# Patient Record
Sex: Female | Born: 1975 | Hispanic: Yes | Marital: Single | Smoking: Never smoker
Health system: Southern US, Community
[De-identification: ages and names within clinical notes are randomized; demographics above are authoritative.]

## PROBLEM LIST (undated history)

## (undated) DIAGNOSIS — G8929 Other chronic pain: Secondary | ICD-10-CM

## (undated) DIAGNOSIS — N946 Dysmenorrhea, unspecified: Secondary | ICD-10-CM

## (undated) DIAGNOSIS — R519 Headache, unspecified: Secondary | ICD-10-CM

## (undated) DIAGNOSIS — R631 Polydipsia: Secondary | ICD-10-CM

## (undated) DIAGNOSIS — R059 Cough, unspecified: Secondary | ICD-10-CM

## (undated) DIAGNOSIS — R7303 Prediabetes: Secondary | ICD-10-CM

## (undated) DIAGNOSIS — R0602 Shortness of breath: Secondary | ICD-10-CM

## (undated) DIAGNOSIS — N3945 Continuous leakage: Secondary | ICD-10-CM

## (undated) DIAGNOSIS — R61 Generalized hyperhidrosis: Secondary | ICD-10-CM

## (undated) DIAGNOSIS — D649 Anemia, unspecified: Secondary | ICD-10-CM

## (undated) DIAGNOSIS — K760 Fatty (change of) liver, not elsewhere classified: Secondary | ICD-10-CM

## (undated) DIAGNOSIS — K219 Gastro-esophageal reflux disease without esophagitis: Secondary | ICD-10-CM

## (undated) DIAGNOSIS — F419 Anxiety disorder, unspecified: Secondary | ICD-10-CM

## (undated) DIAGNOSIS — J029 Acute pharyngitis, unspecified: Secondary | ICD-10-CM

## (undated) DIAGNOSIS — H539 Unspecified visual disturbance: Secondary | ICD-10-CM

## (undated) DIAGNOSIS — K429 Umbilical hernia without obstruction or gangrene: Secondary | ICD-10-CM

## (undated) DIAGNOSIS — M549 Dorsalgia, unspecified: Secondary | ICD-10-CM

## (undated) HISTORY — DX: Continuous leakage: N39.45

## (undated) HISTORY — DX: Umbilical hernia without obstruction or gangrene: K42.9

## (undated) HISTORY — DX: Headache, unspecified: R51.9

## (undated) HISTORY — DX: Dysmenorrhea, unspecified: N94.6

## (undated) HISTORY — PX: ANKLE SURGERY: SHX546

## (undated) HISTORY — DX: Unspecified visual disturbance: H53.9

## (undated) HISTORY — DX: Anemia, unspecified: D64.9

## (undated) HISTORY — DX: Cough, unspecified: R05.9

## (undated) HISTORY — DX: Polydipsia: R63.1

## (undated) HISTORY — DX: Dorsalgia, unspecified: M54.9

## (undated) HISTORY — DX: Anxiety disorder, unspecified: F41.9

## (undated) HISTORY — DX: Shortness of breath: R06.02

## (undated) HISTORY — DX: Generalized hyperhidrosis: R61

## (undated) HISTORY — DX: Prediabetes: R73.03

## (undated) HISTORY — DX: Acute pharyngitis, unspecified: J02.9

## (undated) HISTORY — DX: Fatty (change of) liver, not elsewhere classified: K76.0

## (undated) HISTORY — DX: Other chronic pain: G89.29

## (undated) HISTORY — DX: Gastro-esophageal reflux disease without esophagitis: K21.9

---

## 2019-10-09 ENCOUNTER — Other Ambulatory Visit: Payer: Self-pay

## 2019-10-09 ENCOUNTER — Ambulatory Visit: Payer: Self-pay | Admitting: Physician Assistant

## 2019-10-09 VITALS — BP 130/84 | HR 72 | Temp 98.2°F | Resp 18 | Ht 62.0 in

## 2019-10-09 DIAGNOSIS — R519 Headache, unspecified: Secondary | ICD-10-CM

## 2019-10-09 DIAGNOSIS — J302 Other seasonal allergic rhinitis: Secondary | ICD-10-CM

## 2019-10-09 MED ORDER — FLUTICASONE PROPIONATE 50 MCG/ACT NA SUSP: 2.0000 | Freq: Every day | NASAL | 6 refills | Status: DC

## 2019-10-09 MED ORDER — CETIRIZINE HCL 10 MG PO TABS: 10.0000 mg | ORAL_TABLET | Freq: Every day | ORAL | 11 refills | Status: DC

## 2019-10-09 MED ORDER — IBUPROFEN 600 MG PO TABS: 600.0000 mg | ORAL_TABLET | Freq: Three times a day (TID) | ORAL | 0 refills | Status: DC | PRN

## 2019-10-09 NOTE — Patient Instructions (Signed)
Dolor de cabeza sinusal Sinus Headache  El dolor de cabeza sinusal aparece cuando los senos paranasales se taponan o se inflaman. Los senos paranasales son espacios llenos de aire que se encuentran en el crneo detrs de los huesos de la cara y la frente. Los dolores de cabeza sinusales pueden ser leves o intensos. Cules son las causas? El dolor de cabeza sinusal puede deberse a diferentes trastornos que [employer] senos paranasales. Las causas ms frecuentes son las siguientes:  Resfros.  Infecciones de los senos paranasales.  Alergias. Muchas personas confunden los dolores de cabeza sinusales con migraas o dolores de cabeza tensionales ya que esos dolores de cabeza tambin pueden causar dolor facial y sntomas nasales. Cules son los signos o los sntomas? El principal sntoma de esta afeccin es un dolor de cabeza que se puede percibir como dolor u presin en la cara, la frente, los odos o las piezas dentales superiores. Las personas que tienen dolor de cabeza sinusal suelen tener otros sntomas, por ejemplo:  Secrecin o congestin nasal.  Cristy Hilts.  Imposibilidad para Cytogeneticist. Los cambios climticos pueden intensificar los sntomas. Cmo se diagnostica? Esta afeccin se puede diagnosticar en funcin de lo siguiente:  Un examen fsico y antecedentes mdicos.  Estudios de diagnstico por imgenes, como una tomografa computarizada (TC) o una resonancia magntica (RM), para Product manager presencia de problemas en los senos paranasales.  El examen de los senos paranasales con un instrumento delgado con una cmara que se inserta a travs de la nariz (endoscopa). Cmo se trata? El tratamiento de esta afeccin depende de la causa.  El dolor sinusal causado por una infeccin de los senos paranasales se puede tratar con antibiticos.  El dolor sinusal causado por alergias se puede aliviar con medicamentos antialrgicos (antihistamnicos) y Therapist, nutritional.  El dolor sinusal causado por congestin se puede Public house manager al Child psychotherapist (lavar) la Lawyer y los senos paranasales con una solucin salina.  En algunos casos, puede ser necesaria la ciruga sinusal si otros tratamientos no son eficaces. Siga estas indicaciones en su casa: Instrucciones generales  Si se lo indican: ? Aplique un pao tibio y [employer] cara para ayudar a Best boy. ? Use solucin salina nasal. Medicamentos   Tome los medicamentos de venta libre y los recetados solamente como se lo haya indicado el mdico.  Si le recetaron un antibitico, tmelo como se lo haya indicado el mdico. No deje de tomar el antibitico aunque comience a sentirse mejor.  Si est congestionado, utilice un aerosol nasal para ayudar a Clinical cytogeneticist presin. Hidrtese y humidifique los ambientes  Beba suficiente agua para Theatre manager la orina clara o de color amarillo plido. Mantenerse hidratado lo ayudar a [employer].  Use un humidificador de vapor fro para mantener la humedad de su hogar por encima del 50%.  Realice inhalaciones de vapor por 10 a 85minutos, de 3 a 4veces al da o tal como se lo haya indicado el mdico. Puede hacer esto en el bao con el vapor del agua caliente de la ducha.  Limite la exposicin al aire fro o seco. Comunquese con un mdico si:  Tiene dolores de cabeza ms de una vez por semana.  Tiene sensibilidad a la luz o al ruido.  Tiene fiebre.  Siente nuseas o vomita.  Los dolores de cabeza no mejoran con Dispensing optician. Muchas personas creen que tienen dolor de cabeza sinusal cuando en realidad tiene una migraa o un dolor de cabeza tensional. Solicite ayuda  de inmediato si:  Tiene problemas de visin.  Siente un dolor intenso repentino en el rostro o la cabeza.  Tiene una convulsin.  Se siente confundido.  Presenta rigidez en el cuello. Resumen  El dolor de cabeza sinusal aparece cuando los senos paranasales se taponan o  se inflaman.  El dolor de cabeza sinusal puede deberse a diferentes trastornos que [employer] senos paranasales, como un resfro, una infeccin sinusal o Vella Raring.  El tratamiento de esta afeccin depende de la causa. Esto puede incluir medicamentos, como antibiticos o antihistamnicos. Esta informacin no tiene Theme park manager el consejo del mdico. Asegrese de hacerle al mdico cualquier pregunta que tenga. Document Revised: 08/26/2017 Document Reviewed: 05/29/2017 Elsevier Patient Education  2020 [employer].

## 2019-10-09 NOTE — Progress Notes (Signed)
New Patient Office Visit  Subjective:  Patient ID: Carolee Rota, female    DOB: November 22, 1975  Age: 44 y.o. MRN: [id number]  CC:  Chief Complaint  Patient presents with  . Facial Pain    HPI Francina Ames Bentancourth reports that she has been having pain bilaterally on both cheeks for the past few days.  Reports that she has taken amoxicillin x1 dose and Tylenol without much relief.  Reports that she was given amoxicillin from a friend.  Denies any other URI symptoms.   Due to language barrier, an interpreter was present during the history-taking and subsequent discussion (and for part of the physical exam) with this patient.   History reviewed. No pertinent past medical history.  History reviewed. No pertinent surgical history.  History reviewed. No pertinent family history.  Social History   Socioeconomic History  . Marital status: Unknown    Spouse name: Not on file  . Number of children: Not on file  . Years of education: Not on file  . Highest education level: Not on file  Occupational History  . Not on file  Tobacco Use  . Smoking status: Never Smoker  . Smokeless tobacco: Never Used  Substance and Sexual Activity  . Alcohol use: Not Currently  . Drug use: Never  . Sexual activity: Not Currently  Other Topics Concern  . Not on file  Social History Narrative  . Not on file   Social Determinants of Health   Financial Resource Strain:   . Difficulty of Paying Living Expenses:   Food Insecurity:   . Worried About Programme researcher, broadcasting/film/video in the Last Year:   . Barista in the Last Year:   Transportation Needs:   . Freight forwarder (Medical):   Marland Kitchen Lack of Transportation (Non-Medical):   Physical Activity:   . Days of Exercise per Week:   . Minutes of Exercise per Session:   Stress:   . Feeling of Stress :   Social Connections:   . Frequency of Communication with Friends and Family:   . Frequency of Social Gatherings with Friends and  Family:   . Attends Religious Services:   . Active Member of Clubs or Organizations:   . Attends Banker Meetings:   Marland Kitchen Marital Status:   Intimate Partner Violence:   . Fear of Current or Ex-Partner:   . Emotionally Abused:   Marland Kitchen Physically Abused:   . Sexually Abused:     ROS Review of Systems  Constitutional: Negative for chills, fatigue and fever.  HENT: Positive for sinus pressure and sinus pain. Negative for congestion, ear pain, postnasal drip, sneezing and sore throat.   Eyes: Negative for itching.  Respiratory: Negative for cough.   Cardiovascular: Negative.   Gastrointestinal: Negative.   Endocrine: Negative.   Genitourinary: Negative.   Musculoskeletal: Negative.   Skin: Negative.   Allergic/Immunologic: Negative.   Neurological: Positive for headaches. Negative for dizziness, syncope, facial asymmetry and light-headedness.  Hematological: Negative.   Psychiatric/Behavioral: Negative.     Objective:   Today's Vitals: BP 130/84 (BP Location: Right Arm, Patient Position: Sitting, Cuff Size: Normal)   Pulse 72   Temp 98.2 F (36.8 C) (Oral)   Resp 18   Ht 5\' 2"  (1.575 m)   SpO2 100%   Physical Exam Vitals and nursing note reviewed.  Constitutional:      General: She is not in acute distress.    Appearance: Normal appearance. She is not  ill-appearing.  HENT:     Head: Normocephalic and atraumatic.     Right Ear: Tympanic membrane, ear canal and external ear normal.     Left Ear: Tympanic membrane, ear canal and external ear normal.     Nose: No congestion or rhinorrhea.     Right Turbinates: Enlarged and swollen.     Left Turbinates: Enlarged and swollen.     Right Sinus: Maxillary sinus tenderness and frontal sinus tenderness present.     Left Sinus: Frontal sinus tenderness present.     Mouth/Throat:     Lips: Pink.     Mouth: Mucous membranes are moist.     Dentition: Dental caries present.     Pharynx: Oropharynx is clear. No oropharyngeal  exudate or posterior oropharyngeal erythema.     Tonsils: No tonsillar exudate.  Eyes:     Extraocular Movements: Extraocular movements intact.     Conjunctiva/sclera: Conjunctivae normal.     Pupils: Pupils are equal, round, and reactive to light.  Cardiovascular:     Rate and Rhythm: Normal rate and regular rhythm.     Pulses: Normal pulses.     Heart sounds: Normal heart sounds.  Pulmonary:     Effort: Pulmonary effort is normal.     Breath sounds: Normal breath sounds.  Abdominal:     General: Abdomen is flat. Bowel sounds are normal.     Palpations: Abdomen is soft.  Musculoskeletal:        General: Normal range of motion.     Cervical back: Normal range of motion and neck supple.  Lymphadenopathy:     Cervical: No cervical adenopathy.  Skin:    General: Skin is warm and dry.  Neurological:     General: No focal deficit present.     Mental Status: She is alert and oriented to person, place, and time.  Psychiatric:        Mood and Affect: Mood normal.        Behavior: Behavior normal.        Thought Content: Thought content normal.        Judgment: Judgment normal.     Assessment & Plan:   Problem List Items Addressed This Visit    None    Visit Diagnoses    Sinus headache    -  Primary   Seasonal allergies          No outpatient encounter medications on file as of 10/09/2019.   No facility-administered encounter medications on file as of 10/09/2019.  1. Sinus headache Encouraged trial of Flonase, Zyrtec, ibuprofen.  Gave patient education on proper use of antibiotics.  Patient is uninsured, does not have primary care provider.  Gave patient education on [place] financial assistance, patient given appointment to establish primary care at [facility] on November 06 2019 2. Seasonal allergies   I have reviewed the patient's medical history (PMH, PSH, Social History, Family History, Medications, and allergies) , and have been updated if relevant. I spent  23 minutes reviewing chart and  face to face time with patient.     Follow-up: Return in about 4 weeks (around 11/06/2019) for To establish PCP, with Dr. Juleen China at [place].   Loraine Grip Mayers, PA-C

## 2019-10-10 ENCOUNTER — Telehealth: Payer: Self-pay | Admitting: Pediatric Intensive Care

## 2019-10-10 NOTE — Telephone Encounter (Signed)
Call from client with Allison-case worker at [employer], interpretation. Client describes "toothache" facial swelling/pressure and eye pain. CN advised client of [facility] and that she could arrive any time before 1715 for walk in appointment. CN offered transportation to appointment but client declined and stated she would try to find someone to drive her. CN also advised client that she would not be charged at time of appointment but would receive a bill. CN advised that she or Revonda Standard can assist with CFA application if needed.  Shann Medal RN BSN CNP [phone]

## 2019-11-06 ENCOUNTER — Telehealth: Payer: Self-pay | Admitting: Internal Medicine

## 2020-07-16 ENCOUNTER — Ambulatory Visit: Payer: Self-pay | Admitting: *Deleted

## 2020-07-16 NOTE — Telephone Encounter (Signed)
Please call pt and set up an appointment.

## 2020-07-16 NOTE — Telephone Encounter (Signed)
Attempted to call patient to set up new patient appt. Via interpreter Daphine Deutscher [phone]. No answer, unable to leave voicemail message, mailbox has not been set up.

## 2020-07-16 NOTE — Telephone Encounter (Signed)
Patient requesting to set up new patient appt via interpretor Jose ID# [phone] and reported pain in left side under ribs x 2 months that comes and goes with sour taste in her mouth. Reports pain comes and goes and she has vomited 2 times due to symptoms. Pain can last an entire day and if can affect her appetite and she is unable to eat or drink when pain starts. A warm blanket to her left side helps relieve pain and patient did not reports she has tried any medications to relieve pain. Patient has tried olive oil for symptoms with no relief. Reports she has had episodes of difficulty breathing and sweating during and episode of the left sided pain. Patient denies chest pain, difficulty breathing or sweating or sour taste in her mouth at this time. Patient was a Dr. Visit with her child and unable to complete call and set up appt. Instructed patient via interpreter to call back to set up appt and how to assist with treatment for left side pain and sour taste in mouth. If difficulty breathing and sweating occur seek help. Patient verbalized understanding to call back.   Reason for Disposition . [1] Patient says chest pain feels exactly the same as previously diagnosed "heartburn" AND [2] describes burning in chest AND [3] accompanying sour taste in mouth  Answer Assessment - Initial Assessment Questions 1. LOCATION: "Where does it hurt?"       Left side below ribs 2. RADIATION: "Does the pain go anywhere else?" (e.g., into neck, jaw, arms, back)     Sometimes to her stomach  3. ONSET: "When did the chest pain begin?" (Minutes, hours or days)      Left rib pain started 2 months ago  4. PATTERN "Does the pain come and go, or has it been constant since it started?"  "Does it get worse with exertion?"      Comes and goes  5. DURATION: "How long does it last" (e.g., seconds, minutes, hours)     Varies sometimes a whole day  6. SEVERITY: "How bad is the pain?"  (e.g., Scale 1-10; mild, moderate, or severe)     - MILD (1-3): doesn't interfere with normal activities     - MODERATE (4-7): interferes with normal activities or awakens from sleep    - SEVERE (8-10): excruciating pain, unable to do any normal activities       Moderate to severe.  7. CARDIAC RISK FACTORS: "Do you have any history of heart problems or risk factors for heart disease?" (e.g., angina, prior heart attack; diabetes, high blood pressure, high cholesterol, smoker, or strong family history of heart disease)     Na  8. PULMONARY RISK FACTORS: "Do you have any history of lung disease?"  (e.g., blood clots in lung, asthma, emphysema, birth control pills)     na 9. CAUSE: "What do you think is causing the chest pain?"     Not sure but has a sour taste in her mouth  10. OTHER SYMPTOMS: "Do you have any other symptoms?" (e.g., dizziness, nausea, vomiting, sweating, fever, difficulty breathing, cough)       Difficulty breathing and sweating at times but not now  11. PREGNANCY: "Is there any chance you are pregnant?" "When was your last menstrual period?"       na  Protocols used: CHEST PAIN-A-AH

## 2020-07-17 NOTE — Telephone Encounter (Signed)
Patient called back and was scheduled.

## 2020-08-12 ENCOUNTER — Ambulatory Visit: Payer: Self-pay | Attending: Physician Assistant | Admitting: Physician Assistant

## 2020-08-12 ENCOUNTER — Encounter: Payer: Self-pay | Admitting: Physician Assistant

## 2020-08-12 ENCOUNTER — Other Ambulatory Visit: Payer: Self-pay

## 2020-08-12 DIAGNOSIS — Z131 Encounter for screening for diabetes mellitus: Secondary | ICD-10-CM

## 2020-08-12 DIAGNOSIS — R5383 Other fatigue: Secondary | ICD-10-CM

## 2020-08-12 DIAGNOSIS — R109 Unspecified abdominal pain: Secondary | ICD-10-CM

## 2020-08-12 NOTE — Progress Notes (Signed)
Patient ID: Hayley Garcia, female   DOB: 01-27-1976, 45 y.o.   MRN: [id number] Virtual Visit via Telephone Note  I connected with Hayley Garcia on 08/12/20 at  2:50 PM EDT by telephone and verified that I am speaking with the correct person using two identifiers.  Location: Patient: home Provider: [facility] office [place] with pacific interpreters   I discussed the limitations, risks, security and privacy concerns of performing an evaluation and management service by telephone and the availability of in person appointments. I also discussed with the patient that there may be a patient responsible charge related to this service. The patient expressed understanding and agreed to proceed.  History of Present Illness:  Patient is having fatigue for about 3 months.  She also c/o some burping and midepigastric discomfort for a few months too.  She has no ride and is unable to drive.  She has a 54 year old daughter.  No specific complaints but feels as though she "might die and leave her daughter." no CP/HA/dizziness/SOB/vision change.  Observations/Objective: NAD.  A&Ox3  Assessment and Plan: 1. Screening for diabetes mellitus I have had a lengthy discussion and provided education about insulin resistance and the intake of too much sugar/refined carbohydrates.  I have advised the patient to work at a goal of eliminating sugary drinks, candy, desserts, sweets, refined sugars, processed foods, and white carbohydrates.  The patient expresses understanding.  - Hemoglobin A1c  2. Side pain - Comprehensive metabolic panel - CBC with Differential/Platelet  3. Fatigue, unspecified type - Thyroid Panel With TSH - Vitamin D, 25-hydroxy  Follow Up Instructions: Assign PCP-2-3 months Call 911 with any emergent s/sx.  Patient vertbalizes understanding   Try to call with labs at 1:20 pm  I discussed the assessment and treatment plan with the patient. The patient was provided an  opportunity to ask questions and all were answered. The patient agreed with the plan and demonstrated an understanding of the instructions.   The patient was advised to call back or seek an in-person evaluation if the symptoms worsen or if the condition fails to improve as anticipated.  I provided 19 minutes of non-face-to-face time during this encounter.   Georgian Co, PA-C

## 2020-08-13 LAB — CBC WITH DIFFERENTIAL/PLATELET
Basophils Absolute: 0.1 10*3/uL (ref 0.0–0.2)
Basos: 1 %
EOS (ABSOLUTE): 0.4 10*3/uL (ref 0.0–0.4)
Eos: 4 %
Hematocrit: 46.5 % (ref 34.0–46.6)
Hemoglobin: 15.1 g/dL (ref 11.1–15.9)
Immature Grans (Abs): 0 10*3/uL (ref 0.0–0.1)
Immature Granulocytes: 0 %
Lymphocytes Absolute: 2.1 10*3/uL (ref 0.7–3.1)
Lymphs: 25 %
MCH: 28.3 pg (ref 26.6–33.0)
MCHC: 32.5 g/dL (ref 31.5–35.7)
MCV: 87 fL (ref 79–97)
Monocytes Absolute: 0.7 10*3/uL (ref 0.1–0.9)
Monocytes: 8 %
Neutrophils Absolute: 5.5 10*3/uL (ref 1.4–7.0)
Neutrophils: 62 %
Platelets: 338 10*3/uL (ref 150–450)
RBC: 5.33 x10E6/uL — ABNORMAL HIGH (ref 3.77–5.28)
RDW: 13.2 % (ref 11.7–15.4)
WBC: 8.7 10*3/uL (ref 3.4–10.8)

## 2020-08-13 LAB — THYROID PANEL WITH TSH
Free Thyroxine Index: 2.1 (ref 1.2–4.9)
T3 Uptake Ratio: 26 % (ref 24–39)
T4, Total: 7.9 ug/dL (ref 4.5–12.0)
TSH: 0.858 u[IU]/mL (ref 0.450–4.500)

## 2020-08-13 LAB — COMPREHENSIVE METABOLIC PANEL
ALT: 7 IU/L (ref 0–32)
AST: 16 IU/L (ref 0–40)
Albumin/Globulin Ratio: 1.3 (ref 1.2–2.2)
Albumin: 4.3 g/dL (ref 3.8–4.8)
Alkaline Phosphatase: 83 IU/L (ref 44–121)
BUN/Creatinine Ratio: 16 (ref 9–23)
BUN: 9 mg/dL (ref 6–24)
Bilirubin Total: 0.3 mg/dL (ref 0.0–1.2)
CO2: 21 mmol/L (ref 20–29)
Calcium: 9.4 mg/dL (ref 8.7–10.2)
Chloride: 101 mmol/L (ref 96–106)
Creatinine, Ser: 0.56 mg/dL — ABNORMAL LOW (ref 0.57–1.00)
Globulin, Total: 3.3 g/dL (ref 1.5–4.5)
Glucose: 82 mg/dL (ref 65–99)
Potassium: 4.3 mmol/L (ref 3.5–5.2)
Sodium: 138 mmol/L (ref 134–144)
Total Protein: 7.6 g/dL (ref 6.0–8.5)
eGFR: 115 mL/min/{1.73_m2} (ref >59)

## 2020-08-13 LAB — VITAMIN D 25 HYDROXY (VIT D DEFICIENCY, FRACTURES): Vit D, 25-Hydroxy: 28.9 ng/mL — ABNORMAL LOW (ref 30.0–100.0)

## 2020-08-13 LAB — HEMOGLOBIN A1C
Est. average glucose Bld gHb Est-mCnc: 123 mg/dL
Hgb A1c MFr Bld: 5.9 % — ABNORMAL HIGH (ref 4.8–5.6)

## 2020-08-20 ENCOUNTER — Telehealth: Payer: Self-pay | Admitting: Family Medicine

## 2020-08-20 NOTE — Telephone Encounter (Signed)
Pt was called and informed of what food she is not to eat.

## 2020-08-20 NOTE — Telephone Encounter (Signed)
Copied from CRM [id number]. Topic: General - Other >> Aug 20, 2020 11:34 AM Tamela Oddi wrote: Reason for CRM: Patient called to speak with the nurse because she stated at her last visit the doctor told her not to eat a certain food and she does not remember what it was.  Please call patient to discuss.  CB# [phone]  Pt last appt was with Hayley Garcia

## 2020-10-21 ENCOUNTER — Ambulatory Visit: Payer: Self-pay | Admitting: Family Medicine

## 2020-10-22 ENCOUNTER — Other Ambulatory Visit: Payer: Self-pay

## 2020-10-22 ENCOUNTER — Encounter: Payer: Self-pay | Admitting: Family Medicine

## 2020-10-22 ENCOUNTER — Ambulatory Visit: Payer: Self-pay | Attending: Family Medicine | Admitting: Family Medicine

## 2020-10-22 VITALS — BP 109/73 | HR 78 | Ht 62.0 in | Wt 119.4 lb

## 2020-10-22 DIAGNOSIS — K5909 Other constipation: Secondary | ICD-10-CM

## 2020-10-22 DIAGNOSIS — R109 Unspecified abdominal pain: Secondary | ICD-10-CM

## 2020-10-22 MED ORDER — OMEPRAZOLE 40 MG PO CPDR
40.0000 mg | DELAYED_RELEASE_CAPSULE | Freq: Every day | ORAL | 3 refills | Status: DC
Start: 2020-10-22 — End: 2021-01-07
  Filled 2020-10-22: qty 30, 30d supply, fill #0
  Filled 2020-12-16: qty 30, 30d supply, fill #1

## 2020-10-22 NOTE — Progress Notes (Signed)
Having abdominal pain. ?

## 2020-10-22 NOTE — Patient Instructions (Signed)
Estreimiento, en adultos Constipation, Adult Estreimiento significa que una persona hace menos de tres deposiciones en una semana, tiene dificultades para defecar o las heces (deposiciones) son secas, duras o ms grandes de lo normal. La causa puede ser una afeccin subyacente. Puede empeorar con la edad si una persona toma ciertos medicamentos y no toma suficiente lquido. Siga estas instrucciones en su casa: Comida y bebida  Consuma alimentos con alto contenido de [place], como frijoles, cereales integrales, y frutas y verduras frescas.  Limite el consumo de alimentos que sean bajos en fibra y ricos en grasas y azcares procesados, como los fritos y los dulces. Estos incluyen patatas fritas, hamburguesas, galletas, dulces y refrescos.  Beba suficiente lquido como para Pharmacologist la orina de color amarillo plido.   Instrucciones generales  Haga actividad fsica habitualmente o como se lo haya indicado el mdico. Intente practicar de actividad fsica moderada por semana.  Vaya al bao siempre que sienta ganas de defecar. No se aguante las ganas.  Use los medicamentos de venta libre y los recetados solamente como se lo haya indicado el mdico. Esto incluye suplementos de [place].  En el momento de hacer sus deposiciones: ? Respire profundamente mientras relaja la parte inferior del abdomen. ? Practique la relajacin del suelo plvico.  Controle su afeccin para detectar cualquier cambio. Informe a su mdico acerca de cualquier cambio.  Concurra a todas las visitas de [address] se lo haya indicado el mdico. Esto es importante. Comunquese con un mdico si:  Su dolor empeora.  Tiene fiebre.  No defeca despus de 4das.  Vomita.  [place] o no tiene apetito.  Tiene sangrado por la abertura entre las nalgas (ano).  Las heces son delgadas como un lpiz. Solicite ayuda de inmediato si:  Lance Muss, y los sntomas empeoran repentinamente.  Observa que se  filtran heces o hay sangre en las heces.  Tiene el abdomen distendido.  Siente un dolor intenso en el abdomen.  Se siente mareado o se desmaya. Resumen  Estreimiento significa que una persona hace menos de tres deposiciones en una semana, tiene dificultades para defecar o las heces (deposiciones) son secas, duras o ms grandes de lo normal.  Consuma alimentos con alto contenido de [place], como frijoles, cereales integrales, y frutas y verduras frescas.  Beba suficiente lquido como para Pharmacologist la orina de color amarillo plido.  Use los medicamentos de venta libre y los recetados solamente como se lo haya indicado el mdico. Esto incluye suplementos de [place]. Esta informacin no tiene Theme park manager el consejo del mdico. Asegrese de hacerle al mdico cualquier pregunta que tenga. Document Revised: 06/21/2019 Document Reviewed: 06/21/2019 Elsevier Patient Education  2021 [employer].

## 2020-10-22 NOTE — Progress Notes (Signed)
Subjective:  Patient ID: Hayley Garcia, female    DOB: 27-Apr-1976  Age: 45 y.o. MRN: [id number]  CC: Establish Care   HPI Hayley Garcia presents today to establish care.    Interval History: She has been having abdominal pain on the left side radiating centrally which causes her to loose her appetite. Symptoms have been present for the last 3 months. Pain is not as severe as 3 months ago but it feels like "something heavy". She has had excessive burping and bloating but no dyspepsia, nausea, vomiting. She endorses eating late. She is sometimes constipated.  No past medical history on file.  No past surgical history on file.  No family history on file.  No Known Allergies  Outpatient Medications Prior to Visit  Medication Sig Dispense Refill  . cetirizine (ZYRTEC) 10 MG tablet Take 1 tablet (10 mg total) by mouth daily. (Patient not taking: Reported on 10/22/2020) 30 tablet 11  . fluticasone (FLONASE) 50 MCG/ACT nasal spray Place 2 sprays into both nostrils daily. (Patient not taking: Reported on 10/22/2020) 16 g 6  . ibuprofen (ADVIL) 600 MG tablet Take 1 tablet (600 mg total) by mouth every 8 (eight) hours as needed. (Patient not taking: Reported on 10/22/2020) 30 tablet 0   No facility-administered medications prior to visit.     ROS Review of Systems  Constitutional: Negative for activity change, appetite change and fatigue.  HENT: Negative for congestion, sinus pressure and sore throat.   Eyes: Negative for visual disturbance.  Respiratory: Negative for cough, chest tightness, shortness of breath and wheezing.   Cardiovascular: Negative for chest pain and palpitations.  Gastrointestinal: Positive for abdominal pain and constipation. Negative for abdominal distention.  Endocrine: Negative for polydipsia.  Genitourinary: Negative for dysuria and frequency.  Musculoskeletal: Negative for arthralgias and back pain.  Skin: Negative for rash.   Neurological: Negative for tremors, light-headedness and numbness.  Hematological: Does not bruise/bleed easily.  Psychiatric/Behavioral: Negative for agitation and behavioral problems.    Objective:  BP 109/73   Pulse 78   Ht 5\' 2"  (1.575 m)   Wt 119 lb 6.4 oz (54.2 kg)   SpO2 100%   BMI 21.84 kg/m   BP/Weight 10/22/2020 10/09/2019  Systolic BP 109 130  Diastolic BP 73 84  Wt. (Lbs) 119.4 -  BMI 21.84 -      Physical Exam Constitutional:      Appearance: She is well-developed.  Neck:     Vascular: No JVD.  Cardiovascular:     Rate and Rhythm: Normal rate.     Heart sounds: Normal heart sounds. No murmur heard.   Pulmonary:     Effort: Pulmonary effort is normal.     Breath sounds: Normal breath sounds. No wheezing or rales.  Chest:     Chest wall: No tenderness.  Abdominal:     General: Bowel sounds are normal. There is no distension.     Palpations: Abdomen is soft. There is no mass.     Tenderness: There is no abdominal tenderness.  Musculoskeletal:        General: Normal range of motion.     Right lower leg: No edema.     Left lower leg: No edema.  Neurological:     Mental Status: She is alert and oriented to person, place, and time.  Psychiatric:        Mood and Affect: Mood normal.     CMP Latest Ref Rng & Units 08/12/2020  Glucose 65 -  99 mg/dL 82  BUN 6 - 24 mg/dL 9  Creatinine 1.61 - 0.96 mg/dL 0.45(W)  Sodium 098 - 119 mmol/L 138  Potassium 3.5 - 5.2 mmol/L 4.3  Chloride 96 - 106 mmol/L 101  CO2 20 - 29 mmol/L 21  Calcium 8.7 - 10.2 mg/dL 9.4  Total Protein 6.0 - 8.5 g/dL 7.6  Total Bilirubin 0.0 - 1.2 mg/dL 0.3  Alkaline Phos 44 - 121 IU/L 83  AST 0 - 40 IU/L 16  ALT 0 - 32 IU/L 7    Lipid Panel  No results found for: CHOL, TRIG, HDL, CHOLHDL, VLDL, LDLCALC, LDLDIRECT  CBC    Component Value Date/Time   WBC 8.7 08/12/2020 1439   RBC 5.33 (H) 08/12/2020 1439   HGB 15.1 08/12/2020 1439   HCT 46.5 08/12/2020 1439   PLT 338  08/12/2020 1439   MCV 87 08/12/2020 1439   MCH 28.3 08/12/2020 1439   MCHC 32.5 08/12/2020 1439   RDW 13.2 08/12/2020 1439   LYMPHSABS 2.1 08/12/2020 1439   EOSABS 0.4 08/12/2020 1439   BASOSABS 0.1 08/12/2020 1439    Lab Results  Component Value Date   HGBA1C 5.9 (H) 08/12/2020    Assessment & Plan:  1. Abdominal pain, unspecified abdominal location We will evaluate to exclude H. pylori Advised to avoid eating late Avoid recumbency up to 2 hours postmeal Initiate PPI - omeprazole (PRILOSEC) 40 MG capsule; Take 1 capsule (40 mg total) by mouth daily.  Dispense: 30 capsule; Refill: 3 - H. pylori breath test  2. Other constipation Increase fiber intake Advised to use laxative like MiraLAX if uncontrolled.    Meds ordered this encounter  Medications  . omeprazole (PRILOSEC) 40 MG capsule    Sig: Take 1 capsule (40 mg total) by mouth daily.    Dispense:  30 capsule    Refill:  3    Follow-up: Return if symptoms worsen or fail to improve.       Hoy Register, MD, FAAFP. [facility] [place], [place] [phone]   10/22/2020, 1:40 PM

## 2020-10-23 LAB — H. PYLORI BREATH TEST: H pylori Breath Test: POSITIVE — AB

## 2020-10-25 ENCOUNTER — Other Ambulatory Visit: Payer: Self-pay | Admitting: Family Medicine

## 2020-10-25 DIAGNOSIS — R109 Unspecified abdominal pain: Secondary | ICD-10-CM

## 2020-10-25 MED ORDER — CLARITHROMYCIN 500 MG PO TABS
500.0000 mg | ORAL_TABLET | Freq: Two times a day (BID) | ORAL | 0 refills | Status: DC
  Filled 2020-10-25: qty 28, 14d supply, fill #0

## 2020-10-25 MED ORDER — AMOXICILLIN 500 MG PO CAPS
1000.0000 mg | ORAL_CAPSULE | Freq: Two times a day (BID) | ORAL | 0 refills | Status: DC
  Filled 2020-10-25: qty 56, 14d supply, fill #0

## 2020-10-27 ENCOUNTER — Other Ambulatory Visit: Payer: Self-pay

## 2020-10-29 ENCOUNTER — Telehealth: Payer: Self-pay | Admitting: Family Medicine

## 2020-10-29 ENCOUNTER — Other Ambulatory Visit: Payer: Self-pay

## 2020-10-29 NOTE — Telephone Encounter (Signed)
Pt was called and informed of lab results. 

## 2020-10-29 NOTE — Telephone Encounter (Signed)
Pt would like to hear from a nurse regarding her Breath Test done. Pt was informed nurse is in clinic to give her time to call back. Please advise and thank you.

## 2020-11-19 ENCOUNTER — Telehealth: Payer: Self-pay | Admitting: Family Medicine

## 2020-11-19 NOTE — Telephone Encounter (Signed)
Called pt made aware of MD results note and instructions. Verbalized understanding 

## 2020-11-19 NOTE — Telephone Encounter (Signed)
Pt is calling because she tested postive for H.pylori - pt is ask when she can come into be retested for H. Pylori. Do orders need to be placed? Cb- [phone] can leave a vm if unavailable

## 2020-12-10 ENCOUNTER — Other Ambulatory Visit: Payer: Self-pay

## 2020-12-10 ENCOUNTER — Ambulatory Visit: Payer: Self-pay | Attending: Family Medicine

## 2020-12-10 DIAGNOSIS — R109 Unspecified abdominal pain: Secondary | ICD-10-CM

## 2020-12-12 LAB — H. PYLORI BREATH TEST: H pylori Breath Test: NEGATIVE

## 2020-12-15 ENCOUNTER — Telehealth: Payer: Self-pay | Admitting: *Deleted

## 2020-12-15 NOTE — Telephone Encounter (Signed)
Patient is calling to get her results: H.pylori test after 1 month is negative confirming resolution. Patient notified of her results. Patient states she still has frequent burping, bloating and pain in sides with eating. Patient states she is not taking her Omeprazole. Patient advised she has refills and should continue to take that. Patient requested a follow up appointment- patient did not want to wait for first available appointment with PCP- and she can only come to office on Thursday- patient was scheduled with NP.

## 2020-12-15 NOTE — Telephone Encounter (Signed)
Noted  

## 2020-12-16 ENCOUNTER — Other Ambulatory Visit: Payer: Self-pay

## 2020-12-16 ENCOUNTER — Telehealth: Payer: Self-pay | Admitting: Family Medicine

## 2020-12-16 NOTE — Telephone Encounter (Signed)
Requesting Urgent refill Rx #: [id number]  omeprazole (PRILOSEC) 40 MG capsule [[id number]]   [facility]  [address], [place] [address]  Phone:  [phone]  Fax:  [phone]

## 2020-12-16 NOTE — Telephone Encounter (Signed)
Pt has refills at pharmacy please call patient and inform her that medication is ready at pharmacy here

## 2020-12-17 ENCOUNTER — Other Ambulatory Visit: Payer: Self-pay

## 2021-01-07 ENCOUNTER — Other Ambulatory Visit: Payer: Self-pay

## 2021-01-07 ENCOUNTER — Ambulatory Visit: Payer: Self-pay | Attending: Physician Assistant | Admitting: Physician Assistant

## 2021-01-07 DIAGNOSIS — Z758 Other problems related to medical facilities and other health care: Secondary | ICD-10-CM

## 2021-01-07 DIAGNOSIS — Z789 Other specified health status: Secondary | ICD-10-CM

## 2021-01-07 DIAGNOSIS — R109 Unspecified abdominal pain: Secondary | ICD-10-CM

## 2021-01-07 MED ORDER — PANTOPRAZOLE SODIUM 40 MG PO TBEC
40.0000 mg | DELAYED_RELEASE_TABLET | Freq: Every day | ORAL | 4 refills | Status: DC
  Filled 2021-01-07: qty 30, 30d supply, fill #0

## 2021-01-07 NOTE — Progress Notes (Signed)
Virtual Visit via Telephone Note  I connected with Hayley Garcia on 01/07/21 at  8:30 AM EDT by telephone and verified that I am speaking with the correct person using two identifiers.  Location: Patient: home Provider: [facility] office [facility] interpreter.   I discussed the limitations, risks, security and privacy concerns of performing an evaluation and management service by telephone and the availability of in person appointments. I also discussed with the patient that there may be a patient responsible charge related to this service. The patient expressed understanding and agreed to proceed.   History of Present Illness: patient still c/o abdominal pain and bloating even with taking pantoprazole.  She would like to see a specialist. (See previous OV notes).  No N/V/D.  No melena/hematochezia.  No fever.  No urinary s/sx.  Pain consistent with OV HPI in May 2022:  She has been having abdominal pain on the left side radiating centrally which causes her to loose her appetite. Symptoms have been present for the last 3 months. Pain is not as severe as 3 months ago but it feels like "something heavy". She has had excessive burping and bloating but no dyspepsia, nausea, vomiting. She endorses eating late. She is sometimes constipated.     Observations/Objective:  NAD.  A&Ox3   Assessment and Plan: 1. Abdominal pain, unspecified abdominal location - pantoprazole (PROTONIX) 40 MG tablet; Take 1 tablet (40 mg total) by mouth daily.  Dispense: 30 tablet; Refill: 4 - Ambulatory referral to Gastroenterology  2. Language barrier [employer] interpreters used and additional time performing visit was required.   Follow Up Instructions: See PCP in 3-4 months   I discussed the assessment and treatment plan with the patient. The patient was provided an opportunity to ask questions and all were answered. The patient agreed with the plan and demonstrated an understanding of the instructions.    The patient was advised to call back or seek an in-person evaluation if the symptoms worsen or if the condition fails to improve as anticipated.  I provided 15 minutes of non-face-to-face time during this encounter.   Georgian Co, PA-C  Patient ID: Hayley Garcia, female   DOB: Aug 23, 1975, 45 y.o.   MRN: [id number]

## 2021-01-21 ENCOUNTER — Encounter: Payer: Self-pay | Admitting: Gastroenterology

## 2021-01-28 ENCOUNTER — Telehealth: Payer: Self-pay | Admitting: Family Medicine

## 2021-01-28 NOTE — Telephone Encounter (Signed)
Copied from CRM [id number]. Topic: General - Other >> Jan 21, 2021  3:52 PM Jaquita Rector A wrote: Reason for CRM: Patient called in to say that she was told by a specialist that she can apply for the [facility] Please call on Thursdays please patient at Ph# [phone] >> Jan 28, 2021  1:59 PM Jaquita Rector A wrote: Patient called in asking if Mikle Bosworth can please call her to discuss the application that was sent out to her. Please call Ph# [phone]

## 2021-01-28 NOTE — Telephone Encounter (Signed)
I return Pt call, LVM to call back to schedule a financial appt 

## 2021-02-11 ENCOUNTER — Ambulatory Visit (INDEPENDENT_AMBULATORY_CARE_PROVIDER_SITE_OTHER): Payer: Self-pay | Admitting: Gastroenterology

## 2021-02-11 ENCOUNTER — Encounter: Payer: Self-pay | Admitting: Gastroenterology

## 2021-02-11 VITALS — BP 104/68 | HR 73 | Ht 62.0 in | Wt 122.0 lb

## 2021-02-11 DIAGNOSIS — K5900 Constipation, unspecified: Secondary | ICD-10-CM

## 2021-02-11 DIAGNOSIS — R1084 Generalized abdominal pain: Secondary | ICD-10-CM

## 2021-02-11 DIAGNOSIS — Z1212 Encounter for screening for malignant neoplasm of rectum: Secondary | ICD-10-CM

## 2021-02-11 DIAGNOSIS — Z1211 Encounter for screening for malignant neoplasm of colon: Secondary | ICD-10-CM

## 2021-02-11 NOTE — Progress Notes (Signed)
HPI : Hayley Garcia is a very pleasant 45 year old female referred by Dr. Hoy Register for further evaluation of persistent abdominal pain.  A Spanish language interpreter is present virtually for this encounter.  She was seen by Dr. Odette Horns in May with what was reported as 3 months of abdominal pain, bloating, belching.  H pylori breath test was positive, and she was treated with amoxicillin, clarithromycin and omeprazole.  Repeat breath test a month later was negative.  Symptoms did not improve much with eradication of H. Pylori.  Her PPI was then switched from Prilosec to Protonix.  She reports to me that her pain is usually located on the left side of her abdomen, but sometimes moves to the right side.  She says that the Protonix does improve the pain.  She has difficulty describing the pain, but says sometimes it is a stabbing type of pain. Pain is located on the left side of her abdomen, but sometimes moves to the right side   She has occasional heartburn, but this is not frequent.  She has episodes of nausea and hypersalivation.  She denies problems with dysphagia or vomiting.   Pain does not interfere with sleep.  She takes NSAIDs infrequently.  She has a bowel movement about every other day.  She has straining with bowel movements.  Stools are very hard typically.  She feels bloated frequently, and reports excessive belching.  No blood in the stool or on the toilet paper. Weight is stable.  No prior endoscopic examination.   Past Medical History:  Diagnosis Date   Anemia    Back pain    Chronic headaches    Continuous urine leakage    Cough    Excessive thirst    Menstrual pain    Night sweats    Pre-diabetes    Shortness of breath    Sore throat    Vision changes      Past Surgical History:  Procedure Laterality Date   ANKLE SURGERY Left    History reviewed. No pertinent family history. Social History   Tobacco Use   Smoking status: Never   Smokeless tobacco:  Never  Substance Use Topics   Alcohol use: Not Currently   Drug use: Never   Current Outpatient Medications  Medication Sig Dispense Refill   Nutritional Supplements (VITAMIN D BOOSTER PO) Take 100 mg by mouth daily.     omeprazole (PRILOSEC) 40 MG capsule Take 40 mg by mouth daily.     No current facility-administered medications for this visit.   No Known Allergies   Review of Systems: All systems reviewed and negative except where noted in HPI.    No results found.  Physical Exam: BP 104/68   Pulse 73   Ht 5\' 2"  (1.575 m)   Wt 122 lb (55.3 kg)   BMI 22.31 kg/m  Constitutional: Pleasant,well-developed, Hispanic female in no acute distress. HEENT: Normocephalic and atraumatic. Conjunctivae are normal. No scleral icterus. Cardiovascular: Normal rate, regular rhythm.  Pulmonary/chest: Effort normal and breath sounds normal. No wheezing, rales or rhonchi. Abdominal: Soft, nondistended, tenderness to palpation in the left upper quadrant and epigastrium.  Bowel sounds active throughout. There are no masses palpable. No hepatomegaly. Extremities: no edema Neurological: Alert and oriented to person place and time. Skin: Skin is warm and dry. No rashes noted. Psychiatric: Normal mood and affect. Behavior is normal.  CBC    Component Value Date/Time   WBC 8.7 08/12/2020 1439   RBC 5.33 (  H) 08/12/2020 1439   HGB 15.1 08/12/2020 1439   HCT 46.5 08/12/2020 1439   PLT 338 08/12/2020 1439   MCV 87 08/12/2020 1439   MCH 28.3 08/12/2020 1439   MCHC 32.5 08/12/2020 1439   RDW 13.2 08/12/2020 1439   LYMPHSABS 2.1 08/12/2020 1439   EOSABS 0.4 08/12/2020 1439   BASOSABS 0.1 08/12/2020 1439    CMP     Component Value Date/Time   NA 138 08/12/2020 1439   K 4.3 08/12/2020 1439   CL 101 08/12/2020 1439   CO2 21 08/12/2020 1439   GLUCOSE 82 08/12/2020 1439   BUN 9 08/12/2020 1439   CREATININE 0.56 (L) 08/12/2020 1439   CALCIUM 9.4 08/12/2020 1439   PROT 7.6 08/12/2020 1439    ALBUMIN 4.3 08/12/2020 1439   AST 16 08/12/2020 1439   ALT 7 08/12/2020 1439   ALKPHOS 83 08/12/2020 1439   BILITOT 0.3 08/12/2020 1439     ASSESSMENT AND PLAN: 45 year old female with chronic vague abdominal pain, not clearly upper or lower in etiology.  She does have some symptoms of an upper GI source, such as improvement with PPI and reports of episodes of nausea and hypersalivation.  She also has some symptoms suggestive of a lower GI source of her pain such as constipation, difficulty with bowel movements and improvement in her pain with bowel movements.  She is due for her initial average risk screening colonoscopy.  Given the question of an upper GI source for chronic abdominal pain, I recommended we also perform an upper endoscopy at that time.  We will reassess for H. pylori with gastric biopsies and assess for any evidence of complicated GERD.  In the meantime, I recommended she start taking Metamucil on a daily basis to improve her stool bulk and consistency and reduce straining.  Abdominal pain - EGD - Continue Protonix - Metamucil  Constipation - Metamucil  Colon cancer screening - Screening colonoscopy  The details, risks (including bleeding, perforation, infection, missed lesions, medication reactions and possible hospitalization or surgery if complications occur), benefits, and alternatives to EGD/colonoscopy with possible biopsy and possible polypectomy were discussed with the patient and she consents to proceed.   I spent a total of 55 minutes reviewing the patient's medical record, interviewing and examining the patient, discussing her diagnosis and management of her condition going forward, and documenting in the medical record   Ovid Witman E. Tomasa Rand, MD  Gastroenterology   CC:  Hoy Register, MD

## 2021-02-11 NOTE — Patient Instructions (Addendum)
Si tiene 65 aos o ms, su ndice de [employer] corporal debe estar entre 23 y 30. Su ndice de masa corporal es de 22,31 kg/m. Si esto est fuera del rango mencionado anteriormente, considere hacer un seguimiento con su proveedor de Marine scientist.  Si tiene 64 aos o menos, su ndice de [employer] corporal debe estar entre 19 y 29. Su ndice de masa corporal es de 22,31 kg/m. Si esto est fuera del rango mencionado anteriormente, considere hacer un seguimiento con su proveedor de Marine scientist.  Le han programado una colonoscopia. Siga las instrucciones escritas que se le dieron en su visita de hoy. Recoja sus suministros de preparacin en la farmacia dentro de los prximos 1 a 3 das. Si [place] inhaladores (aunque solo sea necesario), trigalos el da de su procedimiento.  Comience Metamucil diariamente.  Los proveedores de Adult nurse GI desean alentarlo a que use MYCHART para comunicarse con los proveedores en caso de solicitudes o preguntas que no sean urgentes. Debido a los Retail buyer de espera en el telfono, enviar un mensaje a su proveedor por [employer] puede ser una forma ms rpida y eficiente de obtener una respuesta. Espere 48 horas hbiles para obtener [employer]. Recuerde que esto es para solicitudes no urgentes.  Debido a cambios recientes en las [address] de atencin mdica, es posible que vea los [place] de sus estudios de imgenes y de laboratorio en MyChart antes de que su proveedor haya tenido la oportunidad de revisarlos. Entendemos que en algunos casos puede haber resultados confusos o preocupantes para usted. No todos los resultados de laboratorio regresan en el mismo perodo de tiempo y el proveedor puede estar esperando mltiples resultados para Administrator, Civil Service. Denos 48 horas para que su proveedor revise minuciosamente todos los resultados antes de comunicarse con la oficina para Engineer, structural.   Fue un placer verte hoy!  Gracias por confiar en m para su cuidado  gastrointestinal!   Scott E. Tomasa Rand, MD

## 2021-02-18 ENCOUNTER — Ambulatory Visit: Payer: Self-pay

## 2021-02-20 ENCOUNTER — Emergency Department (HOSPITAL_COMMUNITY): Payer: Self-pay

## 2021-02-20 ENCOUNTER — Emergency Department (HOSPITAL_COMMUNITY)
Admission: EM | Admit: 2021-02-20 | Discharge: 2021-02-21 | Disposition: A | Discharge status: Home / Self Care | Payer: Self-pay | Attending: Emergency Medicine | Admitting: Emergency Medicine

## 2021-02-20 ENCOUNTER — Other Ambulatory Visit: Payer: Self-pay

## 2021-02-20 ENCOUNTER — Encounter (HOSPITAL_COMMUNITY): Payer: Self-pay | Admitting: Emergency Medicine

## 2021-02-20 DIAGNOSIS — K2900 Acute gastritis without bleeding: Secondary | ICD-10-CM | POA: Insufficient documentation

## 2021-02-20 DIAGNOSIS — N83202 Unspecified ovarian cyst, left side: Secondary | ICD-10-CM | POA: Insufficient documentation

## 2021-02-20 DIAGNOSIS — R1011 Right upper quadrant pain: Secondary | ICD-10-CM | POA: Insufficient documentation

## 2021-02-20 LAB — URINALYSIS, ROUTINE W REFLEX MICROSCOPIC
Bacteria, UA: NONE SEEN
Bilirubin Urine: NEGATIVE
Glucose, UA: NEGATIVE mg/dL
Ketones, ur: NEGATIVE mg/dL
Leukocytes,Ua: NEGATIVE
Nitrite: NEGATIVE
Protein, ur: NEGATIVE mg/dL
Specific Gravity, Urine: 1.018 (ref 1.005–1.030)
pH: 8 (ref 5.0–8.0)

## 2021-02-20 LAB — I-STAT BETA HCG BLOOD, ED (MC, WL, AP ONLY): I-stat hCG, quantitative: <5 m[IU]/mL (ref <5)

## 2021-02-20 LAB — LIPASE, BLOOD: Lipase: 32 U/L (ref 11–51)

## 2021-02-20 LAB — CBC
HCT: 43 % (ref 36.0–46.0)
Hemoglobin: 13.3 g/dL (ref 12.0–15.0)
MCH: 27.9 pg (ref 26.0–34.0)
MCHC: 30.9 g/dL (ref 30.0–36.0)
MCV: 90.3 fL (ref 80.0–100.0)
Platelets: 384 10*3/uL (ref 150–400)
RBC: 4.76 MIL/uL (ref 3.87–5.11)
RDW: 13.5 % (ref 11.5–15.5)
WBC: 12.5 10*3/uL — ABNORMAL HIGH (ref 4.0–10.5)
nRBC: 0 % (ref 0.0–0.2)

## 2021-02-20 LAB — COMPREHENSIVE METABOLIC PANEL
ALT: 15 U/L (ref 0–44)
AST: 16 U/L (ref 15–41)
Albumin: 3.6 g/dL (ref 3.5–5.0)
Alkaline Phosphatase: 70 U/L (ref 38–126)
Anion gap: 8 (ref 5–15)
BUN: 15 mg/dL (ref 6–20)
CO2: 26 mmol/L (ref 22–32)
Calcium: 8.9 mg/dL (ref 8.9–10.3)
Chloride: 104 mmol/L (ref 98–111)
Creatinine, Ser: 0.5 mg/dL (ref 0.44–1.00)
GFR, Estimated: >60 mL/min (ref >60)
Glucose, Bld: 92 mg/dL (ref 70–99)
Potassium: 4.1 mmol/L (ref 3.5–5.1)
Sodium: 138 mmol/L (ref 135–145)
Total Bilirubin: 0.3 mg/dL (ref 0.3–1.2)
Total Protein: 6.9 g/dL (ref 6.5–8.1)

## 2021-02-20 NOTE — ED Provider Notes (Signed)
[facility] EMERGENCY DEPARTMENT Provider Note   CSN: [id number] Arrival date & time: 02/20/21  1737     History Chief Complaint  Patient presents with   Abdominal Pain    Hayley Garcia is a 45 y.o. female.  The history is provided by the patient and medical records. A language interpreter was used.  Abdominal Pain Hayley Garcia is a 45 y.o. female who presents to the Emergency Department complaining of abdominal pain. She presents to the ED complaining of LUQ pain that radiates to the RUQ.  Pain started 15 days ago.  Pain is worse with meals. Now worsening.  Has belching.  Comes and goes.  Pain is worsening.  Has associated dysuria and constipation. She has been treated with antibiotics for H. pylori incomplete of course. She is not currently on any medications. No prior abdominal surgeries.  No fever, N/V, sob.    Past Medical History:  Diagnosis Date   Anemia    Back pain    Chronic headaches    Continuous urine leakage    Cough    Excessive thirst    Menstrual pain    Night sweats    Pre-diabetes    Shortness of breath    Sore throat    Vision changes     There are no problems to display for this patient.   Past Surgical History:  Procedure Laterality Date   ANKLE SURGERY Left      OB History   No obstetric history on file.     No family history on file.  Social History   Tobacco Use   Smoking status: Never   Smokeless tobacco: Never  Substance Use Topics   Alcohol use: Not Currently   Drug use: Never    Home Medications Prior to Admission medications   Medication Sig Start Date End Date Taking? Authorizing Provider  pantoprazole (PROTONIX) 40 MG tablet Take 1 tablet (40 mg total) by mouth daily. 02/21/21  Yes Tilden Fossa, MD  sucralfate (CARAFATE) 1 GM/10ML suspension Take 10 mLs (1 g total) by mouth 4 (four) times daily -  with meals and at bedtime. 02/21/21  Yes Tilden Fossa, MD  Nutritional  Supplements (VITAMIN D BOOSTER PO) Take 100 mg by mouth daily.    [provider]  omeprazole (PRILOSEC) 40 MG capsule Take 40 mg by mouth daily.    [provider]    Allergies    Patient has no known allergies.  Review of Systems   Review of Systems  Gastrointestinal:  Positive for abdominal pain.  All other systems reviewed and are negative.  Physical Exam Updated Vital Signs BP 107/80   Pulse 72   Temp 98.3 F (36.8 C) (Oral)   Resp 18   LMP 02/04/2021   SpO2 100%   Physical Exam Vitals and nursing note reviewed.  Constitutional:      Appearance: She is well-developed.  HENT:     Head: Normocephalic and atraumatic.  Cardiovascular:     Rate and Rhythm: Normal rate and regular rhythm.     Heart sounds: No murmur heard. Pulmonary:     Effort: Pulmonary effort is normal. No respiratory distress.     Breath sounds: Normal breath sounds.  Abdominal:     Palpations: Abdomen is soft.     Tenderness: There is no guarding or rebound.     Comments: Moderate RUQ tenderness.  Mild left cva tenderness  Musculoskeletal:  General: No swelling or tenderness.  Skin:    General: Skin is warm and dry.  Neurological:     Mental Status: She is alert and oriented to person, place, and time.  Psychiatric:        Behavior: Behavior normal.    ED Results / Procedures / Treatments   Labs (all labs ordered are listed, but only abnormal results are displayed) Labs Reviewed  CBC - Abnormal; Notable for the following components:      Result Value   WBC 12.5 (*)    All other components within normal limits  URINALYSIS, ROUTINE W REFLEX MICROSCOPIC - Abnormal; Notable for the following components:   APPearance HAZY (*)    Hgb urine dipstick SMALL (*)    All other components within normal limits  LIPASE, BLOOD  COMPREHENSIVE METABOLIC PANEL  I-STAT BETA HCG BLOOD, ED ([facility], [facility], [facility] ONLY)    EKG None  Radiology CT Abdomen Pelvis W Contrast  Result Date:  02/21/2021 CLINICAL DATA:  Epigastric abdominal pain. EXAM: CT ABDOMEN AND PELVIS WITH CONTRAST TECHNIQUE: Multidetector CT imaging of the abdomen and pelvis was performed using the standard protocol following bolus administration of intravenous contrast. CONTRAST:  64mL OMNIPAQUE IOHEXOL 350 MG/ML SOLN COMPARISON:  Right upper quadrant ultrasound yesterday. FINDINGS: Lower chest: No acute airspace disease. No pleural effusion. Calcified granuloma in the medial right lower lobe. Hepatobiliary: No focal liver abnormality is seen. No gallstones, gallbladder wall thickening, or biliary dilatation. Pancreas: No ductal dilatation or inflammation. Spleen: Normal in size without focal abnormality. Adrenals/Urinary Tract: Normal adrenal glands. No hydronephrosis or perinephric edema. Homogeneous renal enhancement evidence of stone or focal renal lesion. Urinary bladder is distended without wall thickening. Stomach/Bowel: Unremarkable stomach. There is no small bowel obstruction or inflammation. Normal appendix. Moderate stool in the right colon, with small volume of stool distally. No colonic wall thickening or inflammation. Vascular/Lymphatic: Normal caliber abdominal aorta. No acute vascular findings. Patent portal vein. No abdominopelvic adenopathy. Reproductive: Anteverted uterus with mildly heterogeneous myometrium, no discrete fibroid is seen. There is a septated left ovarian cyst measuring 3.9 cm. There may be some peripheral septal thickening. Normal CT appearance of the right ovary. Other: Small fat containing umbilical hernia. No free air, free fluid, or intra-abdominal fluid collection. Musculoskeletal: There are no acute or suspicious osseous abnormalities. IMPRESSION: 1. No acute abnormality in the abdomen/pelvis. 2. Septated 3.9 cm left ovarian cyst. There may be some peripheral septal thickening. Because this lesion is not adequately characterized, prompt [place] is recommended for further evaluation. Note: This  recommendation does not apply to premenarchal patients and to those with increased risk (genetic, family history, elevated tumor markers or other high-risk factors) of ovarian cancer. Reference: JACR 2020 Feb; 17(2):248-254 3. Small fat containing umbilical hernia. Electronically Signed   By: Narda Rutherford M.D.   On: 02/21/2021 01:44   US Abdomen Limited RUQ (LIVER/GB)  Result Date: 02/20/2021 CLINICAL DATA:  Right upper quadrant pain. EXAM: ULTRASOUND ABDOMEN LIMITED RIGHT UPPER QUADRANT COMPARISON:  None. FINDINGS: Gallbladder: No gallstones or wall thickening visualized. No sonographic Murphy sign noted by sonographer. Common bile duct: Diameter: 2 mm. Liver: No focal lesion identified. Within normal limits in parenchymal echogenicity. Portal vein is patent on color Doppler imaging with normal direction of blood flow towards the liver. Other: None. IMPRESSION: Unremarkable right upper quadrant ultrasound. Electronically Signed   By: Tish Frederickson M.D.   On: 02/20/2021 23:44    Procedures Procedures   Medications Ordered in ED Medications  iohexol (OMNIPAQUE) 350 MG/ML injection 60 mL (60 mLs Intravenous Contrast Given 02/21/21 0115)    ED Course  I have reviewed the triage vital signs and the nursing notes.  Pertinent labs & imaging results that were available during my care of the patient were reviewed by me and considered in my medical decision making (see chart for details).    MDM Rules/Calculators/A&P                          patient here for evaluation of progressive upper abdominal pain over the last 15 days. She has tenderness on examination without peritoneal findings. Initial concern for cholecystitis and ultrasound was obtained, which is negative for cholecystitis or cholelithiasis. CT scan was obtained given her symptoms to evaluate for alternative cause. CT is negative for significant upper abdominal source of her symptoms. She does have a left ovarian cyst, do not feel  that this is clinically relevant to her current symptoms. Discussed with patient findings of CT scan and importance of PCP follow-up for further evaluation. In terms of her upper abdominal pain, will treat for possible gastritis with Protonix and Carafate. Discussed G.I. follow-up for further evaluation. Return precautions discussed.  Final Clinical Impression(s) / ED Diagnoses Final diagnoses:  RUQ pain  Cyst of left ovary  Acute gastritis without hemorrhage, unspecified gastritis type    Rx / DC Orders ED Discharge Orders          Ordered    pantoprazole (PROTONIX) 40 MG tablet  Daily        02/21/21 0359    sucralfate (CARAFATE) 1 GM/10ML suspension  3 times daily with meals & bedtime        02/21/21 0359             Tilden Fossa, MD 02/21/21 [phone]

## 2021-02-20 NOTE — ED Triage Notes (Signed)
C/o L sided abd pain x 4 months that is worse after eating.  Reports constipation and dysuria x 15 days.

## 2021-02-21 ENCOUNTER — Emergency Department (HOSPITAL_COMMUNITY): Payer: Self-pay

## 2021-02-21 MED ORDER — PANTOPRAZOLE SODIUM 40 MG PO TBEC: 40.0000 mg | DELAYED_RELEASE_TABLET | Freq: Every day | ORAL | 0 refills | Status: DC

## 2021-02-21 MED ORDER — SUCRALFATE 1 GM/10ML PO SUSP: 1.0000 g | Freq: Three times a day (TID) | ORAL | 0 refills | Status: DC

## 2021-02-21 MED ORDER — IOHEXOL 350 MG/ML SOLN
60.0000 mL | Freq: Once | INTRAVENOUS | Status: AC | PRN
  Administered 2021-02-21: 60 mL via INTRAVENOUS

## 2021-02-21 NOTE — ED Notes (Signed)
Pt discharged and ambulated out of the ED without difficulty. 

## 2021-02-25 ENCOUNTER — Other Ambulatory Visit: Payer: Self-pay

## 2021-02-25 MED ORDER — PANTOPRAZOLE SODIUM 40 MG PO TBEC
DELAYED_RELEASE_TABLET | ORAL | 0 refills | Status: DC
  Filled 2021-02-25: qty 30, 30d supply, fill #0

## 2021-02-25 MED ORDER — SUCRALFATE 1 GM/10ML PO SUSP
ORAL | 0 refills | Status: DC
  Filled 2021-02-25: qty 420, 10d supply, fill #0

## 2021-03-18 ENCOUNTER — Encounter: Payer: Self-pay | Admitting: Gastroenterology

## 2021-03-18 ENCOUNTER — Telehealth: Payer: Self-pay | Admitting: Gastroenterology

## 2021-03-18 NOTE — Telephone Encounter (Signed)
Good Afternoon Dr. Tomasa Rand,   This patient came in for her procedure and drove herself, she has no family here and does not have a care partner at this time.  I told her that our policy is that she has to have someone here in the event of an emergency and to  drive her home.  She said she would call back to reschedule once she finds someone who can accompany her to her procedure.

## 2021-03-23 ENCOUNTER — Encounter (HOSPITAL_COMMUNITY): Payer: Self-pay | Admitting: Radiology

## 2021-03-26 ENCOUNTER — Ambulatory Visit: Payer: Self-pay | Admitting: Gastroenterology

## 2021-04-01 ENCOUNTER — Ambulatory Visit: Payer: Self-pay

## 2021-04-01 NOTE — Telephone Encounter (Signed)
  Used Spanish interpreter Claudia,# [id number]. Pt. Report she continues to have abdominal discomfort and bloating after eating. States she could not have a procedure done with GI doctor because she was alone and needed someone with her. States she does not have family here, only her 45 year old daughter. Asking for assistance to have "procedure done." Please advise pt.   Answer Assessment - Initial Assessment Questions 1. LOCATION: "Where does it hurt?"      All over 2. RADIATION: "Does the pain shoot anywhere else?" (e.g., chest, back)     No 3. ONSET: "When did the pain begin?" (e.g., minutes, hours or days ago)      For awhile 4. SUDDEN: "Gradual or sudden onset?"     Gradual 5. PATTERN "Does the pain come and go, or is it constant?"    - If constant: "Is it getting better, staying the same, or worsening?"      (Note: Constant means the pain never goes away completely; most serious pain is constant and it progresses)     - If intermittent: "How long does it last?" "Do you have pain now?"     (Note: Intermittent means the pain goes away completely between bouts)     Come and goes 6. SEVERITY: "How bad is the pain?"  (e.g., Scale 1-10; mild, moderate, or severe)   - MILD (1-3): doesn't interfere with normal activities, abdomen soft and not tender to touch    - MODERATE (4-7): interferes with normal activities or awakens from sleep, abdomen tender to touch    - SEVERE (8-10): excruciating pain, doubled over, unable to do any normal activities      Moderate 7. RECURRENT SYMPTOM: "Have you ever had this type of stomach pain before?" If Yes, ask: "When was the last time?" and "What happened that time?"      No 8. CAUSE: "What do you think is causing the stomach pain?"     Gastritis  9. RELIEVING/AGGRAVATING FACTORS: "What makes it better or worse?" (e.g., movement, antacids, bowel movement)     Eating makes it worse 10. OTHER SYMPTOMS: "Do you have any other symptoms?" (e.g., back pain,  diarrhea, fever, urination pain, vomiting)       Bloating 11. PREGNANCY: "Is there any chance you are pregnant?" "When was your last menstrual period?"       No  Protocols used: Abdominal Pain - [facility]

## 2021-04-15 ENCOUNTER — Other Ambulatory Visit: Payer: Self-pay

## 2021-04-15 ENCOUNTER — Ambulatory Visit: Payer: Self-pay | Attending: Family Medicine | Admitting: Family Medicine

## 2021-04-15 ENCOUNTER — Other Ambulatory Visit (HOSPITAL_COMMUNITY)
Admission: RE | Admit: 2021-04-15 | Discharge: 2021-04-15 | Disposition: A | Discharge status: Home / Self Care | Payer: Self-pay | Source: Ambulatory Visit | Attending: Family Medicine | Admitting: Family Medicine

## 2021-04-15 VITALS — BP 109/74 | HR 80 | Ht 62.0 in | Wt 126.0 lb

## 2021-04-15 DIAGNOSIS — N3000 Acute cystitis without hematuria: Secondary | ICD-10-CM

## 2021-04-15 DIAGNOSIS — N898 Other specified noninflammatory disorders of vagina: Secondary | ICD-10-CM | POA: Insufficient documentation

## 2021-04-15 DIAGNOSIS — R140 Abdominal distension (gaseous): Secondary | ICD-10-CM

## 2021-04-15 LAB — POCT URINALYSIS DIP (CLINITEK)
Bilirubin, UA: NEGATIVE
Glucose, UA: NEGATIVE mg/dL
Ketones, POC UA: NEGATIVE mg/dL
Nitrite, UA: NEGATIVE
POC PROTEIN,UA: NEGATIVE
Spec Grav, UA: 1.02 (ref 1.010–1.025)
Urobilinogen, UA: 0.2 E.U./dL
pH, UA: 7 (ref 5.0–8.0)

## 2021-04-15 MED ORDER — CEPHALEXIN 500 MG PO CAPS
500.0000 mg | ORAL_CAPSULE | Freq: Four times a day (QID) | ORAL | 0 refills | Status: DC
  Filled 2021-04-15: qty 14, 4d supply, fill #0

## 2021-04-15 NOTE — Progress Notes (Signed)
Subjective:  Patient ID: Hayley Garcia, female    DOB: 03/03/1976  Age: 45 y.o. MRN: [id number]  CC: Urinary Tract Infection (Itching in vaginal. Every time eats stomach gets bloated.)   HPI Hayley Garcia is a 45 y.o. year old female with a history of who presents today with the following concerns.  Interval History: She complains of persisting bloating after meals. Followed by [facility] and was scheduled for Colonoscopy which was cancelled as she did not have someone accompany her for the procedure. Treated for H.pylori gastritis in 09/2020 with follow-up testing 1 month confirming resolution.  She does have a PPI and sucralfate on her med list which she states she completed a week ago. Complains of Vaginal itching which she has had for 1 month and thinks she has a urinary tract infection. Complains of dysuria but no frequency; endorses presence of whitish  Past Medical History:  Diagnosis Date   Anemia    Back pain    Chronic headaches    Continuous urine leakage    Cough    Excessive thirst    Menstrual pain    Night sweats    Pre-diabetes    Shortness of breath    Sore throat    Vision changes     Past Surgical History:  Procedure Laterality Date   ANKLE SURGERY Left     No family history on file.  No Known Allergies  Outpatient Medications Prior to Visit  Medication Sig Dispense Refill   Nutritional Supplements (VITAMIN D BOOSTER PO) Take 100 mg by mouth daily. (Patient not taking: Reported on 04/15/2021)     omeprazole (PRILOSEC) 40 MG capsule Take 40 mg by mouth daily. (Patient not taking: Reported on 04/15/2021)     pantoprazole (PROTONIX) 40 MG tablet Take 1 tablet (40 mg total) by mouth daily. (Patient not taking: Reported on 04/15/2021) 30 tablet 0   pantoprazole (PROTONIX) 40 MG tablet Take 1 tablet by mouth daily. (Patient not taking: Reported on 04/15/2021) 30 tablet 0   sucralfate (CARAFATE) 1 GM/10ML suspension Take 10 mLs (1 g  total) by mouth 4 (four) times daily -  with meals and at bedtime. (Patient not taking: Reported on 04/15/2021) 420 mL 0   sucralfate (CARAFATE) 1 GM/10ML suspension Take 10 MLs by mouth 4 times daily with meals and at bedtime. (Patient not taking: Reported on 04/15/2021) 420 mL 0   No facility-administered medications prior to visit.     ROS Review of Systems  Constitutional:  Negative for activity change, appetite change and fatigue.  HENT:  Negative for congestion, sinus pressure and sore throat.   Eyes:  Negative for visual disturbance.  Respiratory:  Negative for cough, chest tightness, shortness of breath and wheezing.   Cardiovascular:  Negative for chest pain and palpitations.  Gastrointestinal:  Negative for abdominal distention, abdominal pain and constipation.  Endocrine: Negative for polydipsia.  Genitourinary:  Positive for menstrual problem. Negative for dysuria and frequency.  Musculoskeletal:  Negative for arthralgias and back pain.  Skin:  Negative for rash.  Neurological:  Negative for tremors, light-headedness and numbness.  Hematological:  Does not bruise/bleed easily.  Psychiatric/Behavioral:  Negative for agitation and behavioral problems.    Objective:  BP 109/74   Pulse 80   Ht 5\' 2"  (1.575 m)   Wt 126 lb (57.2 kg)   SpO2 98%   BMI 23.05 kg/m   BP/Weight 04/15/2021 02/21/2021 [id number]  Systolic BP 0000000 [id number]  Diastolic BP  74 80 68  Wt. (Lbs) 126 - 122  BMI 23.05 - 22.31      Physical Exam Constitutional:      Appearance: She is well-developed.  Cardiovascular:     Rate and Rhythm: Normal rate.     Heart sounds: Normal heart sounds. No murmur heard. Pulmonary:     Effort: Pulmonary effort is normal.     Breath sounds: Normal breath sounds. No wheezing or rales.  Chest:     Chest wall: No tenderness.  Abdominal:     General: Bowel sounds are normal. There is no distension.     Palpations: Abdomen is soft. There is no mass.     Tenderness:  There is no abdominal tenderness. There is left CVA tenderness. There is no right CVA tenderness.  Musculoskeletal:        General: Normal range of motion.     Right lower leg: No edema.     Left lower leg: No edema.  Neurological:     Mental Status: She is alert and oriented to person, place, and time.  Psychiatric:        Mood and Affect: Mood normal.    CMP Latest Ref Rng & Units 02/20/2021 08/12/2020  Glucose 70 - 99 mg/dL 92 82  BUN 6 - 20 mg/dL 15 9  Creatinine 5.64 - 1.00 mg/dL 3.32 9.51(O)  Sodium 841 - 145 mmol/L 138 138  Potassium 3.5 - 5.1 mmol/L 4.1 4.3  Chloride 98 - 111 mmol/L 104 101  CO2 22 - 32 mmol/L 26 21  Calcium 8.9 - 10.3 mg/dL 8.9 9.4  Total Protein 6.5 - 8.1 g/dL 6.9 7.6  Total Bilirubin 0.3 - 1.2 mg/dL 0.3 0.3  Alkaline Phos 38 - 126 U/L 70 83  AST 15 - 41 U/L 16 16  ALT 0 - 44 U/L 15 7    Lipid Panel  No results found for: CHOL, TRIG, HDL, CHOLHDL, VLDL, LDLCALC, LDLDIRECT  CBC    Component Value Date/Time   WBC 12.5 (H) 02/20/2021 1808   RBC 4.76 02/20/2021 1808   HGB 13.3 02/20/2021 1808   HGB 15.1 08/12/2020 1439   HCT 43.0 02/20/2021 1808   HCT 46.5 08/12/2020 1439   PLT 384 02/20/2021 1808   PLT 338 08/12/2020 1439   MCV 90.3 02/20/2021 1808   MCV 87 08/12/2020 1439   MCH 27.9 02/20/2021 1808   MCHC 30.9 02/20/2021 1808   RDW 13.5 02/20/2021 1808   RDW 13.2 08/12/2020 1439   LYMPHSABS 2.1 08/12/2020 1439   EOSABS 0.4 08/12/2020 1439   BASOSABS 0.1 08/12/2020 1439    Lab Results  Component Value Date   HGBA1C 5.9 (H) 08/12/2020    Assessment & Plan:  1. Vaginal itching - Cervicovaginal ancillary only  2. Acute cystitis without hematuria Treated for UTI - POCT URINALYSIS DIP (CLINITEK) - Urine Culture - cephALEXin (KEFLEX) 500 MG capsule; Take 1 capsule (500 mg total) by mouth 4 (four) times daily.  Dispense: 14 capsule; Refill: 0  3. Abdominal bloating Previously treated for H. pylori gastritis 5 months ago with resulting  negative test 1 month later. Currently being followed by [facility] Advised to call GI to schedule her appointment including rescheduling colonoscopy   Meds ordered this encounter  Medications   cephALEXin (KEFLEX) 500 MG capsule    Sig: Take 1 capsule (500 mg total) by mouth 4 (four) times daily.    Dispense:  14 capsule    Refill:  0  Follow-up: Return if symptoms worsen or fail to improve.       Charlott Rakes, MD, FAAFP. [facility] [place], [place]   04/15/2021, 12:58 PM

## 2021-04-15 NOTE — Patient Instructions (Signed)
Infeccin urinaria en los adultos Urinary Tract Infection, Adult Una infeccin urinaria (IU) puede ocurrir en cualquier lugar de las vas urinarias. Las vas urinarias incluyen lo siguiente: Los riones. Los urteres. La vejiga. La uretra. Estos rganos fabrican, almacenan y eliminan el pis (orina) del cuerpo. Cules son las causas? La causa de esta infeccin es la presencia de grmenes (bacterias) en la zona genital. Estos grmenes proliferan y causan hinchazn (inflamacin) de las vas urinarias. Qu incrementa el riesgo? Los siguientes factores pueden hacer que sea ms propensa a contraer esta afeccin: Usar un tubo delgado y pequeo (catter) para drenar el pis. Imposibilidad de controlar el momento de orinar o defecar (incontinencia). Ser mujer. Si usted es mujer, estas cosas pueden aumentar el riesgo: Usar estos mtodos para evitar un embarazo: Un medicamento que mata los espermatozoides (espermicida). Un dispositivo que impide el paso de los espermatozoides (diafragma). Tener niveles bajos de una hormona femenina (estrgeno). Estar embarazada. Es ms probable que sufra esta afeccin si: Tiene genes que aumentan su riesgo. Es sexualmente activa. Toma antibiticos. Tiene dificultad para orinar debido a: Su prstata es ms grande de lo normal, si usted es hombre. Obstruccin en la parte del cuerpo que drena el pis de la vejiga. Clculo renal. Un trastorno nervioso que afecta la vejiga. No bebe una cantidad suficiente de lquido. No hace pis con la frecuencia suficiente. Tiene otras afecciones, como: Diabetes. Un sistema que combate las enfermedades (sistema inmunitario) debilitado. Anemia drepanoctica. Gota. Lesin en la columna vertebral. Cules son los signos o sntomas? Los sntomas de esta afeccin incluyen: Necesidad inmediata de hacer pis. Hacer poca cantidad de pis con mucha frecuencia. Dolor o ardor al hacer pis. Sangre en el pis. Pis que huele mal o  anormal. Dificultad para hacer pis. Pis turbio. Lquido que sale de la vagina, si es mujer. Dolor en la barriga o en la parte baja de la espalda. Otros sntomas pueden incluir los siguientes: Vmitos. Falta de apetito. Sentirse confundido (confuso). Este puede ser el primer sntoma en los adultos mayores. Sentirse cansado y malhumorado (irritable). Fiebre. Materia fecal lquida (diarrea). Cmo se trata? Recibir antibiticos. Recibir otros medicamentos. Beber una cantidad suficiente de agua. En algunos casos, es posible que deba consultar a un especialista. Siga estas instrucciones en su casa:  Medicamentos Use los medicamentos de venta libre y los recetados solamente como se lo haya indicado el mdico. Si le recetaron un antibitico, tmelo como se lo haya indicado el mdico. No deje de tomarlo aunque comience a sentirse mejor. Instrucciones generales Asegrese de hacer lo siguiente: Haga pis hasta que la vejiga quede vaca. No contenga el pis durante mucho tiempo. Vaciar la vejiga despus de tener sexo. Lmpiese de adelante hacia atrs despus de hacer pis o defecar, si es mujer. Use cada trozo de papel higinico solo una vez cuando se limpie. Beba suficiente lquido como para mantener la orina de color amarillo plido. Cumpla con todas las visitas de seguimiento. Comunquese con un mdico si: No mejora despus de 1 o 2 das de tratamiento. Los sntomas desaparecen y luego reaparecen. Solicite ayuda de inmediato si: Tiene un dolor muy intenso en la espalda. Tiene dolor muy intenso en la parte baja de la barriga. Tiene fiebre. Tiene escalofros. Se siente como si fuera a vomitar o vomita. Resumen Una infeccin urinaria (IU) puede ocurrir en cualquier lugar de las vas urinarias. Esta afeccin es causada por la presencia de grmenes en la zona genital. Existen muchos factores de riesgo de sufrir una IU. El tratamiento   antibiticos. Beba suficiente lquido como para  Pharmacologist la orina de color amarillo plido. Esta informacin no tiene Theme park manager el consejo del mdico. Asegrese de hacerle al mdico cualquier pregunta que tenga. Document Revised: 03/27/2020 Document Reviewed: 03/27/2020 Elsevier Patient Education  2022 [employer].

## 2021-04-16 LAB — CERVICOVAGINAL ANCILLARY ONLY
Bacterial Vaginitis (gardnerella): NEGATIVE
Candida Glabrata: NEGATIVE
Candida Vaginitis: NEGATIVE
Chlamydia: NEGATIVE
Comment: NEGATIVE
Comment: NEGATIVE
Comment: NEGATIVE
Comment: NEGATIVE
Comment: NEGATIVE
Comment: NORMAL
Neisseria Gonorrhea: NEGATIVE
Trichomonas: NEGATIVE

## 2021-04-16 NOTE — Telephone Encounter (Signed)
Hey Dr. Alvis Lemmings, I received this message. I'm not exactly sure how I need to assist her.

## 2021-04-16 NOTE — Telephone Encounter (Signed)
This should not be for you [name]. I advised her at her recent visit to contact her GI specialist regarding ongoing symptoms and challenges with having someone accompany her for recommended Colonoscopy. I have treated her from a Primary Care standpoint and she needs to follow up with GI. Hayley Garcia it would be best to speak to her since she is Spanish speaking. Thanks.

## 2021-04-17 LAB — URINE CULTURE: Organism ID, Bacteria: NO GROWTH

## 2021-04-20 ENCOUNTER — Telehealth: Payer: Self-pay

## 2021-04-20 NOTE — Telephone Encounter (Signed)
-----   Message from Hoy Register, MD sent at 04/19/2021 11:56 AM EST ----- Please inform the patient that labs are normal. Thank you.

## 2021-04-20 NOTE — Telephone Encounter (Signed)
Patient name and DOB has been verified Patient was informed of lab results. Patient had no questions.  

## 2021-04-29 NOTE — Telephone Encounter (Signed)
Called pt unable to reach or leave VM. Call does not go through.

## 2021-05-03 NOTE — Telephone Encounter (Signed)
Unable to make call , call does not go through.

## 2021-06-17 ENCOUNTER — Ambulatory Visit (AMBULATORY_SURGERY_CENTER): Payer: Self-pay

## 2021-06-17 ENCOUNTER — Other Ambulatory Visit: Payer: Self-pay

## 2021-06-17 VITALS — Wt 125.6 lb

## 2021-06-17 DIAGNOSIS — Z1211 Encounter for screening for malignant neoplasm of colon: Secondary | ICD-10-CM

## 2021-06-17 DIAGNOSIS — R1084 Generalized abdominal pain: Secondary | ICD-10-CM

## 2021-06-17 NOTE — Progress Notes (Signed)
No egg or soy allergy known to patient  No issues known to pt with past sedation with any surgeries or procedures Patient denies ever being told they had issues or difficulty with intubation  No FH of Malignant Hyperthermia Pt is not on diet pills Pt is not on home 02  Pt is not on blood thinners  Pt reports issues with constipation - patient reports she takes lemon tea to get relief from constipation- patient advised to increase po fluids, activity, and fruits/veggies No A fib or A flutter Pt is fully vaccinated for Covid x 2; NO PA's for preps discussed with pt in PV today  Discussed with pt there will be an out-of-pocket cost for prep and that varies from $0 to 70 + dollars - pt verbalized understanding  Due to the COVID-19 pandemic we are asking patients to follow certain guidelines in PV and the [facility]   Pt aware of COVID protocols and [facility] guidelines  PV completed via spanish interpreter present. Pt verified name, DOB, address and insurance during PV today.  Patient given a spanish consent to review and keep. Pt encouraged to call with questions or issues.

## 2021-07-01 ENCOUNTER — Other Ambulatory Visit: Payer: Self-pay

## 2021-07-01 ENCOUNTER — Ambulatory Visit: Payer: Self-pay

## 2021-07-01 ENCOUNTER — Ambulatory Visit (AMBULATORY_SURGERY_CENTER): Payer: Self-pay | Admitting: Gastroenterology

## 2021-07-01 ENCOUNTER — Encounter: Payer: Self-pay | Admitting: Gastroenterology

## 2021-07-01 ENCOUNTER — Ambulatory Visit: Payer: Self-pay | Admitting: Gastroenterology

## 2021-07-01 VITALS — BP 129/87 | HR 78 | Resp 23

## 2021-07-01 VITALS — BP 100/66 | HR 81 | Temp 98.4°F | Resp 15 | Ht 63.0 in | Wt 125.6 lb

## 2021-07-01 DIAGNOSIS — Z1211 Encounter for screening for malignant neoplasm of colon: Secondary | ICD-10-CM

## 2021-07-01 DIAGNOSIS — K319 Disease of stomach and duodenum, unspecified: Secondary | ICD-10-CM

## 2021-07-01 DIAGNOSIS — R1084 Generalized abdominal pain: Secondary | ICD-10-CM

## 2021-07-01 DIAGNOSIS — K2950 Unspecified chronic gastritis without bleeding: Secondary | ICD-10-CM

## 2021-07-01 DIAGNOSIS — K2970 Gastritis, unspecified, without bleeding: Secondary | ICD-10-CM

## 2021-07-01 MED ORDER — OMEPRAZOLE 20 MG PO CPDR
20.0000 mg | DELAYED_RELEASE_CAPSULE | Freq: Every day | ORAL | 3 refills | Status: DC
  Filled 2021-07-01: qty 30, 30d supply, fill #0

## 2021-07-01 MED ORDER — SODIUM CHLORIDE 0.9 % IV SOLN: 500.0000 mL | Freq: Once | INTRAVENOUS | Status: DC

## 2021-07-01 MED ORDER — SODIUM CHLORIDE 0.9 % IV SOLN: 500.0000 mL | INTRAVENOUS | Status: DC

## 2021-07-01 NOTE — Patient Instructions (Addendum)
USTED TUVO UN PROCEDIMIENTO ENDOSCPICO HOY EN EL [facility]:   Lea el informe del procedimiento que se le entreg para cualquier pregunta especfica sobre lo que se encontr durante su examen.  Si el informe del examen no responde a sus preguntas, por favor llame a su gastroenterlogo para aclararlo.  Si usted solicit que no se le den detalles de lo que se encontr en su procedimiento al acompaante que le va a cuidar, entonces el informe del procedimiento se ha incluido en un sobre sellado para que usted lo revise despus cuando le sea ms conveniente.   LO QUE PUEDE ESPERAR: Algunas sensaciones de hinchazn en el abdomen.  Puede tener ms gases de lo normal.  El caminar puede ayudarle a eliminar el aire que se le puso en el tracto gastrointestinal durante el procedimiento y reducir la hinchazn.  Si le hicieron una endoscopia inferior (como una colonoscopia o una sigmoidoscopia flexible), podra notar manchas de sangre en las heces fecales o en el papel higinico.  Si se someti a una preparacin intestinal para su procedimiento, es posible que no tenga una evacuacin intestinal normal durante algunos das.   Tenga en cuenta:  Es posible que note un poco de irritacin y congestin en la nariz o algn drenaje.  Esto es debido al oxgeno utilizado durante su procedimiento.  No hay que preocuparse y esto debe desaparecer ms o menos en un da.   SNTOMAS PARA REPORTAR INMEDIATAMENTE:  Despus de una endoscopia inferior (colonoscopia o sigmoidoscopia flexible):  Cantidades excesivas de sangre en las heces fecales  Sensibilidad significativa o empeoramiento de los dolores abdominales   Hinchazn aguda del abdomen que antes no tena   Fiebre de 100F o ms   Despus de la endoscopia superior (EGD)  Vmitos de sangre o material como caf molido   Dolor en el pecho o dolor debajo de los omplatos que antes no tena   Dolor o dificultad persistente para tragar  Falta de aire que antes no  tena   Fiebre de 100F o ms  Heces fecales negras y pegajosas   Para asuntos urgentes o de emergencia, puede comunicarse con un gastroenterlogo a cualquier hora llamando al [phone].  DIETA:  Recomendamos una comida pequea al principio, pero luego puede continuar con su dieta normal.  Tome muchos lquidos, pero debe evitar las bebidas alcohlicas durante 24 horas.    ACTIVIDAD:  Debe planear tomarse las cosas con calma por el resto del da y no debe CONDUCIR ni usar maquinaria pesada hasta maana (debido a los medicamentos de sedacin utilizados durante el examen).     SEGUIMIENTO: Nuestro personal llamar al nmero que aparece en su historial al siguiente da hbil de su procedimiento para ver cmo se siente y para responder cualquier pregunta o inquietud que pueda tener con respecto a la informacin que se le dio despus del procedimiento. Si no podemos contactarle, le dejaremos un mensaje.  Sin embargo, si se siente bien y no tiene ningn problema, no es necesario que nos devuelva la llamada.  Asumiremos que ha regresado a sus actividades diarias normales sin incidentes. Si se le tomaron algunas biopsias, le contactaremos por telfono o por carta en las prximas 3 semanas.  Si no ha sabido nada sobre las biopsias en el transcurso de 3 semanas, por favor llmenos al [phone].   FIRMAS/CONFIDENCIALIDAD: Usted y/o el acompaante que le cuide han firmado documentos que se ingresarn en su historial mdico electrnico.  Estas firmas atestiguan el hecho   de que la informacin anterior  

## 2021-07-01 NOTE — Progress Notes (Signed)
Pt non-responsive, VVS, Report to RN  °

## 2021-07-01 NOTE — Op Note (Signed)
[facility] Patient Name: Hayley Garcia Procedure Date: 07/01/2021 11:33 AM MRN: [id number] Endoscopist: Lorin Picket E. Tomasa Rand , MD Age: 46 Referring MD:  Date of Birth: 19-Jul-1975 Gender: Female Account #: [id number] Procedure:                Upper GI endoscopy Indications:              Upper abdominal pain, Suspected esophageal reflux Medicines:                Monitored Anesthesia Care Procedure:                Pre-Anesthesia Assessment:                           - Prior to the procedure, a History and Physical                            was performed, and patient medications and                            allergies were reviewed. The patient's tolerance of                            previous anesthesia was also reviewed. The risks                            and benefits of the procedure and the sedation                            options and risks were discussed with the patient.                            All questions were answered, and informed consent                            was obtained. Prior Anticoagulants: The patient has                            taken no previous anticoagulant or antiplatelet                            agents. ASA Grade Assessment: II - A patient with                            mild systemic disease. After reviewing the risks                            and benefits, the patient was deemed in                            satisfactory condition to undergo the procedure.                           After obtaining informed consent, the endoscope was  passed under direct vision. Throughout the                            procedure, the patient's blood pressure, pulse, and                            oxygen saturations were monitored continuously. The                            Endoscope was introduced through the mouth, and                            advanced to the third part of duodenum. The upper                             GI endoscopy was accomplished without difficulty.                            The patient tolerated the procedure well. Scope In: Scope Out: Findings:                 The examined portions of the nasopharynx,                            oropharynx and larynx were normal.                           The examined esophagus was normal.                           The entire examined stomach was normal. Biopsies                            were taken with a cold forceps for Helicobacter                            pylori testing. Estimated blood loss was minimal.                           The examined duodenum was normal. Complications:            No immediate complications. Estimated Blood Loss:     Estimated blood loss was minimal. Impression:               - The examined portions of the nasopharynx,                            oropharynx and larynx were normal.                           - Normal esophagus.                           - Normal stomach. Biopsied.                           -  Normal examined duodenum.                           - No endoscopic abnormalities to explain abdominal                            pain. Recommendation:           - Patient has a contact number available for                            emergencies. The signs and symptoms of potential                            delayed complications were discussed with the                            patient. Return to normal activities tomorrow.                            Written discharge instructions were provided to the                            patient.                           - Resume previous diet.                           - Continue present medications.                           - Await pathology results.                           - Recommend trial of Prilosec (omeprazole) 20 mg PO                            daily for 4 weeks.                           - Follow up as needed in clinic. Donneisha Beane E. Tomasa Rand,  MD 07/01/2021 11:47:43 AM This report has been signed electronically.

## 2021-07-01 NOTE — Telephone Encounter (Signed)
Patient returned call via interpreter [place], [place] #[id number]. Patient reports she was told to call PCP after her EGD procedure if her stomach continues to feel hot . Patient reports her stomach gets hot and goes to her spine. Requesting if PCP wants to schedule another bacterial test and lab work. Please advise . Patient waiting for a call back . Instructed patient if pain worsens to go to ED for evaluation. Patient verbalized understanding.

## 2021-07-01 NOTE — Progress Notes (Signed)
[facility] History and Physical   Primary Care Physician:  Hoy Register, MD   Reason for Procedure:   Colon cancer screening, evaluation of abdominal pain  Plan:    Screening colonoscopy, diagnostic EGD     HPI: Hayley Garcia is a 46 y.o. female undergoing initial average risk screening colonoscopy.  She has no family history of colon cancer.  She is bothered by chronic constipation and upper abdominal pain.  She has a history of H. Pylori that was treated but her pain did not improve.  EGD to evaluate for peptic ulcer disease, persistent H. Pylori infection.   Past Medical History:  Diagnosis Date   Anemia    hx of   Anxiety    not on meds at this time   Back pain    Chronic headaches    Continuous urine leakage    Cough    Excessive thirst    GERD (gastroesophageal reflux disease)    not on meds as of 06/17/2021   Menstrual pain    Night sweats    Pre-diabetes    Shortness of breath    Sore throat    Vision changes     Past Surgical History:  Procedure Laterality Date   ANKLE SURGERY Left    pin    Prior to Admission medications   Medication Sig Start Date End Date Taking? Authorizing Provider  cholecalciferol (VITAMIN D3) 25 MCG (1000 UNIT) tablet Take 2,000 Units by mouth daily.   Yes [provider]  Nutritional Supplements (VITAMIN D BOOSTER PO) Take 100 mg by mouth daily. Patient not taking: Reported on 07/01/2021    [provider]    Current Outpatient Medications  Medication Sig Dispense Refill   cholecalciferol (VITAMIN D3) 25 MCG (1000 UNIT) tablet Take 2,000 Units by mouth daily.     Nutritional Supplements (VITAMIN D BOOSTER PO) Take 100 mg by mouth daily. (Patient not taking: Reported on 07/01/2021)     Current Facility-Administered Medications  Medication Dose Route Frequency Provider Last Rate Last Admin   0.9 %  sodium chloride infusion  500 mL Intravenous Once Jenel Lucks, MD         Allergies as of 07/01/2021   (No Known Allergies)    Family History  Problem Relation Age of Onset   Colon polyps Neg Hx    Colon cancer Neg Hx    Esophageal cancer Neg Hx    Rectal cancer Neg Hx    Stomach cancer Neg Hx     Social History   Socioeconomic History   Marital status: Single    Spouse name: Not on file   Number of children: 2   Years of education: Not on file   Highest education level: Not on file  Occupational History   Occupation: Programmer, applications  Tobacco Use   Smoking status: Never   Smokeless tobacco: Never  Vaping Use   Vaping Use: Never used  Substance and Sexual Activity   Alcohol use: Not Currently   Drug use: Never   Sexual activity: Not Currently  Other Topics Concern   Not on file  Social History Narrative   Not on file   Social Determinants of Health   Financial Resource Strain: Not on file  Food Insecurity: Not on file  Transportation Needs: Not on file  Physical Activity: Not on file  Stress: Not on file  Social Connections: Not on file  Intimate Partner Violence: Not on file    Review  of Systems:  All other review of systems negative except as mentioned in the HPI.  Physical Exam: Vital signs BP 134/88    Pulse 97    Temp 98.4 F (36.9 C)    Ht 5\' 3"  (1.6 m)    Wt 125 lb 9.6 oz (57 kg)    LMP 06/06/2021    SpO2 98%    BMI 22.25 kg/m   General:   Alert,  Well-developed, well-nourished, pleasant and cooperative in NAD; accompanied by medical translator Airway:  Mallampati 1 Lungs:  Clear throughout to auscultation.   Heart:  Regular rate and rhythm; no murmurs, clicks, rubs,  or gallops. Abdomen:  Soft, nontender and nondistended. Normal bowel sounds.   Neuro/Psych:  Normal mood and affect. A and O x 3   Alberta Lenhard E. 08/04/2021, MD [facility]

## 2021-07-01 NOTE — Telephone Encounter (Addendum)
Attempted to reach pt. Pt had complained of abdominal "Burning" radiating to back. Left Vm to call back to discuss symptoms. Called [phone] Assisted by [phone]

## 2021-07-01 NOTE — Telephone Encounter (Addendum)
Agent Hayley Garcia gave report of pt and had interpreter on line and was unable to transfer call to this NT. Advised I would call pt and interpreter. Hayley Garcia #[id number] called pt's number but gets message for invalid number. I will reach out to Agent and see if pt called in with different number for triage. Pt called in on number listed in chart so will attempt again later.

## 2021-07-01 NOTE — Progress Notes (Signed)
Called to room to assist during endoscopic procedure.  Patient ID and intended procedure confirmed with present staff. Received instructions for my participation in the procedure from the performing physician.  

## 2021-07-02 ENCOUNTER — Encounter: Payer: Self-pay | Admitting: Internal Medicine

## 2021-07-02 ENCOUNTER — Other Ambulatory Visit: Payer: Self-pay

## 2021-07-02 ENCOUNTER — Telehealth: Payer: Self-pay | Admitting: Family Medicine

## 2021-07-02 ENCOUNTER — Ambulatory Visit: Payer: Self-pay | Attending: Internal Medicine | Admitting: Internal Medicine

## 2021-07-02 VITALS — BP 127/84 | HR 83 | Resp 16 | Wt 127.0 lb

## 2021-07-02 DIAGNOSIS — R1013 Epigastric pain: Secondary | ICD-10-CM

## 2021-07-02 MED ORDER — OMEPRAZOLE 20 MG PO CPDR
20.0000 mg | DELAYED_RELEASE_CAPSULE | Freq: Two times a day (BID) | ORAL | 3 refills | Status: DC
  Filled 2021-07-02: qty 60, 30d supply, fill #0

## 2021-07-02 NOTE — Telephone Encounter (Signed)
She is under the care of GI. Please provide her with the number to contact them for an appointment. Thanks.

## 2021-07-02 NOTE — Progress Notes (Signed)
Patient ID: Hayley Garcia, female    DOB: Dec 02, 1975  MRN: [id number]  CC: Abdominal Pain   Subjective: Hayley Garcia is a 46 y.o. female who presents for UC.  PCP is Dr. Alvis Lemmings.  She was last seen by her 04/15/2021. Her concerns today include:   Patient presents as an urgent care visit today for abdominal pain issue.  In reviewing her chart, looks like she has had chronic issues with abdominal pain and has had work-up including positive H. pylori for which she was treated and retest was negative.  She had CT scan of the abdomen and gallbladder ultrasound which showed no acute findings in the abdomen.  She had EGD done yesterday which was negative.  Biopsy for H. pylori results is pending. Today she complains of feeling bloated in the abdomen and gets a burning pain that goes from the epigastric area around the abdomen to the back x15 days.  She has not had any fever.  She does get some nausea.  Bowel movements have been regular.  She is currently on her menstrual cycle.  She is on omeprazole 20 mg daily.    Current Outpatient Medications on File Prior to Visit  Medication Sig Dispense Refill   cholecalciferol (VITAMIN D3) 25 MCG (1000 UNIT) tablet Take 2,000 Units by mouth daily.     Nutritional Supplements (VITAMIN D BOOSTER PO) Take 100 mg by mouth daily. (Patient not taking: Reported on 07/01/2021)     No current facility-administered medications on file prior to visit.    No Known Allergies  Social History   Socioeconomic History   Marital status: Single    Spouse name: Not on file   Number of children: 2   Years of education: Not on file   Highest education level: Not on file  Occupational History   Occupation: Programmer, applications  Tobacco Use   Smoking status: Never   Smokeless tobacco: Never  Vaping Use   Vaping Use: Never used  Substance and Sexual Activity   Alcohol use: Not Currently   Drug use: Never   Sexual activity: Not Currently  Other Topics  Concern   Not on file  Social History Narrative   Not on file   Social Determinants of Health   Financial Resource Strain: Not on file  Food Insecurity: Not on file  Transportation Needs: Not on file  Physical Activity: Not on file  Stress: Not on file  Social Connections: Not on file  Intimate Partner Violence: Not on file    Family History  Problem Relation Age of Onset   Colon polyps Neg Hx    Colon cancer Neg Hx    Esophageal cancer Neg Hx    Rectal cancer Neg Hx    Stomach cancer Neg Hx     Past Surgical History:  Procedure Laterality Date   ANKLE SURGERY Left    pin    ROS: Review of Systems Negative except as stated above  PHYSICAL EXAM: BP 127/84 (BP Location: Left Arm, Patient Position: Sitting, Cuff Size: Normal)    Pulse 83    Resp 16    Wt 127 lb (57.6 kg)    LMP 07/02/2021 (Exact Date)    SpO2 97%    BMI 22.50 kg/m   Physical Exam  General appearance - alert, well appearing, and in no distress Mental status - normal mood, behavior, speech, dress, motor activity, and thought processes Abdomen -nondistended, normal bowel sounds.  Mild tenderness in the left  and right mid portions of the abdomen.  No rebound tenderness.  Results for orders placed or performed in visit on 04/15/21  Urine Culture   Specimen: Urine   UR  Result Value Ref Range   Urine Culture, Routine Final report    Organism ID, Bacteria No growth   POCT URINALYSIS DIP (CLINITEK)  Result Value Ref Range   Color, UA yellow yellow   Clarity, UA cloudy (A) clear   Glucose, UA negative negative mg/dL   Bilirubin, UA negative negative   Ketones, POC UA negative negative mg/dL   Spec Grav, UA 7.106 2.694 - 1.025   Blood, UA small (A) negative   pH, UA 7.0 5.0 - 8.0   POC PROTEIN,UA negative negative, trace   Urobilinogen, UA 0.2 0.2 or 1.0 E.U./dL   Nitrite, UA Negative Negative   Leukocytes, UA Small (1+) (A) Negative  Cervicovaginal ancillary only  Result Value Ref Range    Neisseria Gonorrhea Negative    Chlamydia Negative    Trichomonas Negative    Bacterial Vaginitis (gardnerella) Negative    Candida Vaginitis Negative    Candida Glabrata Negative    Comment      Normal Reference Range Bacterial Vaginosis - Negative   Comment Normal Reference Range Candida Species - Negative    Comment Normal Reference Range Candida Galbrata - Negative    Comment Normal Reference Range Trichomonas - Negative    Comment Normal Reference Ranger Chlamydia - Negative    Comment      Normal Reference Range Neisseria Gonorrhea - Negative     CMP Latest Ref Rng & Units 02/20/2021 08/12/2020  Glucose 70 - 99 mg/dL 92 82  BUN 6 - 20 mg/dL 15 9  Creatinine 8.54 - 1.00 mg/dL 6.27 0.35(K)  Sodium 093 - 145 mmol/L 138 138  Potassium 3.5 - 5.1 mmol/L 4.1 4.3  Chloride 98 - 111 mmol/L 104 101  CO2 22 - 32 mmol/L 26 21  Calcium 8.9 - 10.3 mg/dL 8.9 9.4  Total Protein 6.5 - 8.1 g/dL 6.9 7.6  Total Bilirubin 0.3 - 1.2 mg/dL 0.3 0.3  Alkaline Phos 38 - 126 U/L 70 83  AST 15 - 41 U/L 16 16  ALT 0 - 44 U/L 15 7   Lipid Panel  No results found for: CHOL, TRIG, HDL, CHOLHDL, VLDL, LDLCALC, LDLDIRECT  CBC    Component Value Date/Time   WBC 12.5 (H) 02/20/2021 1808   RBC 4.76 02/20/2021 1808   HGB 13.3 02/20/2021 1808   HGB 15.1 08/12/2020 1439   HCT 43.0 02/20/2021 1808   HCT 46.5 08/12/2020 1439   PLT 384 02/20/2021 1808   PLT 338 08/12/2020 1439   MCV 90.3 02/20/2021 1808   MCV 87 08/12/2020 1439   MCH 27.9 02/20/2021 1808   MCHC 30.9 02/20/2021 1808   RDW 13.5 02/20/2021 1808   RDW 13.2 08/12/2020 1439   LYMPHSABS 2.1 08/12/2020 1439   EOSABS 0.4 08/12/2020 1439   BASOSABS 0.1 08/12/2020 1439    ASSESSMENT AND PLAN: 1. Dyspepsia Await results of H. pylori test from EGD done yesterday.  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  GERD precautions discussed and encouraged including foods to avoid.  Increase omeprazole to 20 mg twice a day - Lipase -  omeprazole (PRILOSEC) 20 MG capsule; Take 1 capsule (20 mg total) by mouth 2 (two) times daily before a meal.  Dispense: 60 capsule; Refill: 3  [employer] Language interpreter used during this encounter. #[id number], Marlana Salvage  Patient was given the opportunity to ask questions.  Patient verbalized understanding of the plan and was able to repeat key elements of the plan.   Orders Placed This Encounter  Procedures   Lipase     Requested Prescriptions   Signed Prescriptions Disp Refills   omeprazole (PRILOSEC) 20 MG capsule 60 capsule 3    Sig: Take 1 capsule (20 mg total) by mouth 2 (two) times daily before a meal.    No follow-ups on file.  Jonah Blueeborah Eyanna Mcgonagle, MD, FACP

## 2021-07-02 NOTE — Op Note (Addendum)
[facility] Patient Name: Hayley Garcia Procedure Date: 07/01/2021 11:00 AM MRN: [id number] Endoscopist: Lorin PicketScott E. Tomasa Randunningham , MD Age: [age] Referring MD:  Date of Birth: 09/16/1975 Gender: Female Account #: [id number] Procedure:                Colonoscopy Indications:              Screening for colorectal malignant neoplasm, This                            is the patient's first colonoscopy Medicines:                Monitored Anesthesia Care Procedure:                Pre-Anesthesia Assessment:                           - Prior to the procedure, a History and Physical                            was performed, and patient medications and                            allergies were reviewed. The patient's tolerance of                            previous anesthesia was also reviewed. The risks                            and benefits of the procedure and the sedation                            options and risks were discussed with the patient.                            All questions were answered, and informed consent                            was obtained. Prior Anticoagulants: The patient has                            taken no previous anticoagulant or antiplatelet                            agents. ASA Grade Assessment: II - A patient with                            mild systemic disease. After reviewing the risks                            and benefits, the patient was deemed in                            satisfactory condition to undergo the procedure.  After obtaining informed consent, the colonoscope                            was passed under direct vision. Throughout the                            procedure, the patient's blood pressure, pulse, and                            oxygen saturations were monitored continuously. The                            Olympus PCF-H190DL [id number]) Colonoscope was                            introduced  through the anus and advanced to the the                            terminal ileum, with identification of the                            appendiceal orifice and IC valve. The colonoscopy                            was performed without difficulty. The patient                            tolerated the procedure well. The quality of the                            bowel preparation was adequate. The terminal ileum,                            ileocecal valve, appendiceal orifice, and rectum                            were photographed. The bowel preparation used was                            Miralax via split dose instruction. Scope In: 11:16:29 AM Scope Out: 11:31:23 AM Scope Withdrawal Time: 0 hours 10 minutes 21 seconds  Total Procedure Duration: 0 hours 14 minutes 54 seconds  Findings:                 The perianal and digital rectal examinations were                            normal. Pertinent negatives include normal                            sphincter tone and no palpable rectal lesions.                           The colon (entire examined portion) appeared normal.  The terminal ileum appeared normal.                           The retroflexed view of the distal rectum and anal                            verge was normal and showed no anal or rectal                            abnormalities. Complications:            No immediate complications. Estimated Blood Loss:     Estimated blood loss: none. Impression:               - The entire examined colon is normal.                           - The examined portion of the ileum was normal.                           - The distal rectum and anal verge are normal on                            retroflexion view.                           - No specimens collected. Recommendation:           - Patient has a contact number available for                            emergencies. The signs and symptoms of potential                             delayed complications were discussed with the                            patient. Return to normal activities tomorrow.                            Written discharge instructions were provided to the                            patient.                           - Resume previous diet.                           - Continue present medications.                           - Repeat colonoscopy in 10 years for screening                            purposes. Karen Kinnard E. Tomasa Rand, MD 07/01/2021 11:44:25 AM This report has been signed electronically.

## 2021-07-02 NOTE — Progress Notes (Signed)
Burning sensation in abdomin radiating to back x 15 days

## 2021-07-02 NOTE — Telephone Encounter (Signed)
Patient reports her stomach is inflamed. Requesting if PCP wants to schedule another bacterial test and lab work Please advise Cb- [phone]

## 2021-07-03 LAB — LIPASE: Lipase: 27 U/L (ref 14–72)

## 2021-07-05 ENCOUNTER — Telehealth: Payer: Self-pay | Admitting: *Deleted

## 2021-07-05 NOTE — Telephone Encounter (Signed)
°  Follow up Call-  Call back number 07/01/2021  Post procedure Call Back phone  # [phone]  Permission to leave phone message Yes     Patient questions:   Phone kept ringing.

## 2021-07-05 NOTE — Telephone Encounter (Signed)
Left message on f/u call 

## 2021-07-05 NOTE — Telephone Encounter (Signed)
Called pt unable to reach or leave VM , call does not go through.

## 2021-07-08 ENCOUNTER — Encounter: Payer: Self-pay | Admitting: Gastroenterology

## 2021-07-08 NOTE — Telephone Encounter (Signed)
Called pt person answered stated she is no with the patient and will have her return my call. Contact name and info provided

## 2021-07-08 NOTE — Telephone Encounter (Signed)
Pt has been evaluated by a provider since telephone note.

## 2021-07-08 NOTE — Telephone Encounter (Signed)
Patient returned your follow-up call after her procedure. She stated to be feeling fine.  

## 2021-07-14 ENCOUNTER — Telehealth: Payer: Self-pay

## 2021-07-14 NOTE — Telephone Encounter (Signed)
Copied from CRM [id number]. Topic: General - Call Back - No Documentation >> Jul 14, 2021  8:04 AM Hayley Garcia wrote: Reason for CRM: pt calling back for lab and urine results. Please call tomorrow, as she will be out today.

## 2021-07-15 NOTE — Telephone Encounter (Signed)
Returned pt call and Hayley Garcia spoke with pt and made aware that labs are normal

## 2021-07-22 ENCOUNTER — Telehealth: Payer: Self-pay | Admitting: Gastroenterology

## 2021-07-22 NOTE — Telephone Encounter (Signed)
Inbound call from patient requesting EGD results please. 

## 2021-07-22 NOTE — Telephone Encounter (Signed)
Path letter results left for pt on her voicemail. Let he know letter should be coming in the mail to her also.

## 2021-07-22 NOTE — Telephone Encounter (Signed)
Pt returned your call and was given results. She did not have further questions.

## 2021-07-29 ENCOUNTER — Other Ambulatory Visit: Payer: Self-pay

## 2021-07-29 ENCOUNTER — Ambulatory Visit: Payer: Self-pay | Attending: Physician Assistant | Admitting: Physician Assistant

## 2021-07-29 ENCOUNTER — Encounter: Payer: Self-pay | Admitting: Physician Assistant

## 2021-07-29 VITALS — BP 112/78 | HR 89 | Wt 130.8 lb

## 2021-07-29 DIAGNOSIS — Z789 Other specified health status: Secondary | ICD-10-CM

## 2021-07-29 DIAGNOSIS — R062 Wheezing: Secondary | ICD-10-CM

## 2021-07-29 DIAGNOSIS — R1013 Epigastric pain: Secondary | ICD-10-CM

## 2021-07-29 MED ORDER — OMEPRAZOLE 20 MG PO CPDR
20.0000 mg | DELAYED_RELEASE_CAPSULE | Freq: Two times a day (BID) | ORAL | 3 refills | Status: DC
  Filled 2021-07-29: qty 60, 30d supply, fill #0
  Filled 2021-09-16: qty 60, 30d supply, fill #1
  Filled 2022-01-13: qty 120, 60d supply, fill #2

## 2021-07-29 MED ORDER — ALBUTEROL SULFATE HFA 108 (90 BASE) MCG/ACT IN AERS
2.0000 | INHALATION_SPRAY | Freq: Four times a day (QID) | RESPIRATORY_TRACT | 2 refills | Status: DC | PRN
  Filled 2021-07-29: qty 18, 25d supply, fill #0

## 2021-07-29 NOTE — Patient Instructions (Signed)
Indigesti?n ?Indigestion ?La indigesti?n es una sensaci?n de dolor, [place], ardor o saciedad en la parte superior del vientre (abdomen). Puede irse y volver. Puede suceder a menudo o en raras ocasiones. La indigesti?n suele suceder mientras est? comiendo o inmediatamente despu?s de terminar de comer. ?Puede ser peor: ?Por la noche. ?Al inclinarse hacia abajo. ?Al estar acostado. ?La indigesti?n puede ser un s?ntoma de otra afecci?n. ?Siga estas instrucciones en su casa: ?Comida y bebida ? ?Siga el plan de alimentaci?n que le d? el m?dico. ?Es posible que deba evitar alimentos y bebidas, por ejemplo: ?Chocolate y cacao. ?Menta y esencia de [place]. ?Ajo y cebolla. ?R?bano picante. ?Alimentos ?cidos y condimentados, tales como: ?Pimientos. ?[place] en polvo y curry en polvo. ?Vinagre. ?Salsas picantes y [employer]. ?Las frutas c?tricas, tales como: ?Naranjas. ?Limones. ?Limas. ?Alimentos a base de tomate, tales como: ?Salsa roja y pizza con salsa roja. ?[place]. ?Salsa. ?Alimentos fritos y Scientist, physiological, tales como: ?[place]. ?Papas fritas y papas fritas de bolsa. ?Aderezos con alto contenido de Holiday representative. ?Carnes con alto contenido de [place], tales como: ?Perros calientes y Primary school teacher. ?Chuletas. ?Jam?n y tocino. ?Productos l?cteos con alto contenido de grasa, tales como: ?Leche entera. ?[place]. ?Queso crema. ?Caf? y t? negro, con o sin cafe?na. ?Bebidas que contengan alcohol. ?Bebidas energ?ticas y deportivas. ?Bebidas gaseosas o refrescos. ?Jugos de frutas c?tricas. ?Consuma peque?as cantidades de comida con m?s frecuencia. Evite consumir porciones abundantes. ?Evite beber grandes cantidades de l?quidos con las comidas. ?Evite comer 2 o 3 horas antes de acostarse. ?Evite recostarse inmediatamente despu?s de comer. ?Evite hacer ejercicio durante 2 horas despu?s de comer. ?Estilo de vida ?  ?Mantenga un peso saludable. Preg?ntele a su m?dico cu?l es el peso saludable para usted. Si necesita adelgazar, consulte a su  m?dico. ?Haga actividad f?sica durante, al menos, 30 minutos 5 d?as por semana o m?s, o como se lo haya indicado el m?dico. ?Evite los ejercicios que impliquen inclinarse hacia delante. Estos pueden empeorar los s?ntomas. ?Use ropa holgada. No use nada apretado alrededor de la cintura. ?No fume ni consuma ning?n producto que contenga nicotina o tabaco. Estos pueden empeorar los s?ntomas. Si necesita ayuda para dejar de fumar, consulte al m?dico. ?Levante (eleve) la cabecera de la cama aproximadamente 6 pulgadas (15 cm) para dormir. Para hacerlo puede usar una cu?a. ?Intente reducir el nivel de estr?s. Si necesita ayuda para hacer esto, consulte al m?dico. ?Instrucciones generales ?Use los medicamentos de venta libre y los recetados solamente como se lo haya indicado el m?dico. ?No tome aspirina ni antiinflamatorios no esteroideos (AINE), como el ibuprofeno, a menos que el m?dico lo autorice. ?Controle si hay alg?n cambio en sus s?ntomas. ?Cumpla con todas las visitas de seguimiento. ?Comun?quese con un m?dico si: ?Aparecen nuevos s?ntomas. ?Adelgaza y no sabe por qu?. ?Tiene problemas para tragar, o le duele cuando traga. ?Los s?ntomas no mejoran con Scientist, research (medical). ?Sus s?ntomas duran m?s de 2 d?as. ?Tiene fiebre. ?Vomita. ?Solicite ayuda de inmediato si: ?Siente dolor repentino [employer], el cuello, la mand?bula, los dientes o la espalda. ?De repente se siente transpirado, mareado o aturdido. ?Se desmaya. ?Siente falta de aire o Journalist, newspaper. ?No puede dejar de vomitar o vomita sangre. ?Las deposiciones (heces) son sanguinolentas o negras. ?Tiene dolor muy intenso en el abdomen. ?Estos s?ntomas pueden Customer service manager. Solicite ayuda de inmediato. Comun?quese con el servicio de emergencias de su localidad (911 en los [place]). ?No espere a ver si los s?ntomas desaparecen. ?No conduzca por sus propios medios  hasta el hospital. ?Resumen ?La indigesti?n es una sensaci?n de dolor, Associate Professor,  ardor o saciedad en la parte superior del vientre. Suele suceder [employer]	[place] est? comiendo o inmediatamente despu?s de terminar de comer. ?Siga el plan de alimentaci?n y otros cambios en el estilo de vida que le haya indicado el m?dico. ?Use los medicamentos de venta libre y los recetados solamente como se lo haya indicado el m?dico. No tome aspirina ni antiinflamatorios no esteroideos (AINE), como el ibuprofeno, a menos que el m?dico lo autorice. ?Comun?quese con el m?dico si los s?ntomas no mejoran o si empeoran. ?Algunos s?ntomas pueden representar un problema grave que constituye Radio broadcast assistant. No espere a ver si los s?ntomas desaparecen. Solicite atenci?n m?dica de inmediato. ?Esta informaci?n no tiene Theme park manager el consejo del m?dico. Aseg?rese de hacerle al m?dico cualquier pregunta que tenga. ?Document Revised: 01/01/2020 Document Reviewed: 01/01/2020 ?Elsevier Patient Education ? 2022 Elsevier Inc. ? ?

## 2021-07-29 NOTE — Progress Notes (Signed)
Patient ID: Hayley Garcia, female   DOB: 1976/03/27, 45 y.o.   MRN: [id number] ? ? ? ? ?Hayley Garcia, is a 46 y.o. female ? ?[id number] ? ?[id number] ?DOB - Aug 14, 1975 ? ?Chief Complaint  ?Patient presents with  ? Wheezing  ?    ? ?Subjective:  ? ?Hayley Garcia is a 46 y.o. female here today bc she ran out of omeprazole and did not understand she already has a prescription at the pharmacy.  She is also having some wheezing.  All of her symtoms are worse at night.  No fevers.  No chest pressure.   ? ?No problems updated. ? ?ALLERGIES: ?No Known Allergies ? ?PAST MEDICAL HISTORY: ?Past Medical History:  ?Diagnosis Date  ? Anemia   ? hx of  ? Anxiety   ? not on meds at this time  ? Back pain   ? Chronic headaches   ? Continuous urine leakage   ? Cough   ? Excessive thirst   ? GERD (gastroesophageal reflux disease)   ? not on meds as of 06/17/2021  ? Menstrual pain   ? Night sweats   ? Pre-diabetes   ? Shortness of breath   ? Sore throat   ? Vision changes   ? ? ?MEDICATIONS AT HOME: ?Prior to Admission medications   ?Medication Sig Start Date End Date Taking? Authorizing Provider  ?albuterol (VENTOLIN HFA) 108 (90 Base) MCG/ACT inhaler Inhale 2 puffs into the lungs every 6 (six) hours as needed for wheezing or shortness of breath. 07/29/21  Yes Argentina Donovan, PA-C  ?cholecalciferol (VITAMIN D3) 25 MCG (1000 UNIT) tablet Take 2,000 Units by mouth daily.    [provider]  ?Nutritional Supplements (VITAMIN D BOOSTER PO) Take 100 mg by mouth daily. ?Patient not taking: Reported on 07/01/2021    [provider]  ?omeprazole (PRILOSEC) 20 MG capsule Take 1 capsule (20 mg total) by mouth 2 (two) times daily before a meal. 07/29/21   Argentina Donovan, PA-C  ? ? ?ROS: ?Neg HEENT ?Neg cardiac ?Neg GU ?Neg MS ?Neg psych ?Neg neuro ? ?Objective:  ? ?Vitals:  ? 07/29/21 1423  ?BP: 112/78  ?Pulse: 89  ?SpO2: 97%  ?Weight: 130 lb 12.8 oz (59.3 kg)  ? ?Exam ?General appearance :  Awake, alert, not in any distress. Speech Clear. Not toxic looking ?HEENT: Atraumatic and Normocephalic ?Neck: Supple, no JVD. No cervical lymphadenopathy.  ?Chest: Good air entry bilaterally, CTAB.  No rales/rhonchi.  There is minimal wheezing with forced expiration ?CVS: S1 S2 regular, no murmurs.  ?Extremities: B/L Lower Ext shows no edema, both legs are warm to touch ?Neurology: Awake alert, and oriented X 3, CN II-XII intact, Non focal ?Skin: No Rash ? ?Data Review ?Lab Results  ?Component Value Date  ? HGBA1C 5.9 (H) 08/12/2020  ? ? ?Assessment & Plan  ? ?1. Dyspepsia ?Not new ?- omeprazole (PRILOSEC) 20 MG capsule; Take 1 capsule (20 mg total) by mouth 2 (two) times daily before a meal.  Dispense: 60 capsule; Refill: 3 ? ?2. Language barrier ?AMN "Sandra"interpreters used and additional time performing visit was required. ? ? ?3. Wheezing ?- albuterol (VENTOLIN HFA) 108 (90 Base) MCG/ACT inhaler; Inhale 2 puffs into the lungs every 6 (six) hours as needed for wheezing or shortness of breath.  Dispense: 18 g; Refill: 2 ? ? ? ?Patient have been counseled extensively about nutrition and exercise. Other issues discussed during this visit include: low cholesterol diet, weight control and  daily exercise, foot care, annual eye examinations at Ophthalmology, importance of adherence with medications and regular follow-up. We also discussed long term complications of uncontrolled diabetes and hypertension.  ? ?Return if symptoms worsen or fail to improve. ? ?The patient was given clear instructions to go to ER or return to medical center if symptoms don't improve, worsen or new problems develop. The patient verbalized understanding. The patient was told to call to get lab results if they haven't heard anything in the next week.  ? ? ? ? ?Freeman Caldron, PA-C ?[place] ?[place], [place] ?[phone]   ?07/29/2021, 2:42 PM  ?

## 2021-09-08 ENCOUNTER — Ambulatory Visit: Payer: Self-pay | Admitting: *Deleted

## 2021-09-08 DIAGNOSIS — K219 Gastro-esophageal reflux disease without esophagitis: Secondary | ICD-10-CM | POA: Insufficient documentation

## 2021-09-08 NOTE — Telephone Encounter (Signed)
?  Chief Complaint: Burning throat ?Symptoms: Stomach burning "Up to throat" Started Omeprazole in March, not helping ?Frequency: 1 month ?Pertinent Negatives: Patient denies fever ?Disposition: [] ED /[] Urgent Care (no appt availability in office) / [x] Appointment(In office/virtual)/ []  [facility]/ [] Home Care/ [] Refused Recommended Disposition /[] [facility]/ []  Follow-up with PCP ?Additional Notes: Appt with CAri tomorrow AM. Reason for Disposition ? SEVERE (e.g., excruciating) throat pain ? ?Answer Assessment - Initial Assessment Questions ?1. ONSET: "When did the throat start hurting?" (Hours or days ago)  ?    1 month ago ?2. SEVERITY: "How bad is the sore throat?" (Scale 1-10; mild, moderate or severe) ?  - MILD (1-3):  doesn't interfere with eating or normal activities ?  - MODERATE (4-7): interferes with eating some solids and normal activities ?  - SEVERE (8-10):  excruciating pain, interferes with most normal activities ?  - SEVERE DYSPHAGIA: can't swallow liquids, drooling ?    moderate ?3. STREP EXPOSURE: "Has there been any exposure to strep within the past week?" If Yes, ask: "What type of contact occurred?"  ?    no ?4.  VIRAL SYMPTOMS: "Are there any symptoms of a cold, such as a runny nose, cough, hoarse voice or red eyes?"  ?    no ?5. FEVER: "Do you have a fever?" If Yes, ask: "What is your temperature, how was it measured, and when did it start?" ?    No ?6. PUS ON THE TONSILS: "Is there pus on the tonsils in the back of your throat?" ?    no ?7. OTHER SYMPTOMS: "Do you have any other symptoms?" (e.g., difficulty breathing, headache, rash) ?    At times, inhaler making throat burn, stomach burning to throat ? ?Protocols used: Sore Throat-A-AH ? ?

## 2021-09-09 ENCOUNTER — Other Ambulatory Visit: Payer: Self-pay

## 2021-09-09 ENCOUNTER — Emergency Department (HOSPITAL_COMMUNITY): Payer: Self-pay

## 2021-09-09 ENCOUNTER — Emergency Department (HOSPITAL_COMMUNITY)
Admission: EM | Admit: 2021-09-09 | Discharge: 2021-09-09 | Disposition: A | Discharge status: Home / Self Care | Payer: Self-pay | Attending: Emergency Medicine | Admitting: Emergency Medicine

## 2021-09-09 ENCOUNTER — Encounter (HOSPITAL_COMMUNITY): Payer: Self-pay

## 2021-09-09 ENCOUNTER — Ambulatory Visit: Payer: Self-pay | Admitting: Physician Assistant

## 2021-09-09 DIAGNOSIS — K219 Gastro-esophageal reflux disease without esophagitis: Secondary | ICD-10-CM

## 2021-09-09 LAB — URINALYSIS, ROUTINE W REFLEX MICROSCOPIC
Bilirubin Urine: NEGATIVE
Glucose, UA: NEGATIVE mg/dL
Ketones, ur: NEGATIVE mg/dL
Leukocytes,Ua: NEGATIVE
Nitrite: NEGATIVE
Protein, ur: NEGATIVE mg/dL
Specific Gravity, Urine: 1.006 (ref 1.005–1.030)
pH: 7 (ref 5.0–8.0)

## 2021-09-09 LAB — CBC WITH DIFFERENTIAL/PLATELET
Abs Immature Granulocytes: 0.04 10*3/uL (ref 0.00–0.07)
Basophils Absolute: 0.1 10*3/uL (ref 0.0–0.1)
Basophils Relative: 1 %
Eosinophils Absolute: 0.4 10*3/uL (ref 0.0–0.5)
Eosinophils Relative: 3 %
HCT: 41.6 % (ref 36.0–46.0)
Hemoglobin: 13.3 g/dL (ref 12.0–15.0)
Immature Granulocytes: 0 %
Lymphocytes Relative: 23 %
Lymphs Abs: 2.8 10*3/uL (ref 0.7–4.0)
MCH: 28.1 pg (ref 26.0–34.0)
MCHC: 32 g/dL (ref 30.0–36.0)
MCV: 87.9 fL (ref 80.0–100.0)
Monocytes Absolute: 0.9 10*3/uL (ref 0.1–1.0)
Monocytes Relative: 7 %
Neutro Abs: 8 10*3/uL — ABNORMAL HIGH (ref 1.7–7.7)
Neutrophils Relative %: 66 %
Platelets: 333 10*3/uL (ref 150–400)
RBC: 4.73 MIL/uL (ref 3.87–5.11)
RDW: 14 % (ref 11.5–15.5)
WBC: 12.2 10*3/uL — ABNORMAL HIGH (ref 4.0–10.5)
nRBC: 0 % (ref 0.0–0.2)

## 2021-09-09 LAB — LIPASE, BLOOD: Lipase: 31 U/L (ref 11–51)

## 2021-09-09 LAB — COMPREHENSIVE METABOLIC PANEL
ALT: 13 U/L (ref 0–44)
AST: 16 U/L (ref 15–41)
Albumin: 3.6 g/dL (ref 3.5–5.0)
Alkaline Phosphatase: 76 U/L (ref 38–126)
Anion gap: 7 (ref 5–15)
BUN: 9 mg/dL (ref 6–20)
CO2: 27 mmol/L (ref 22–32)
Calcium: 9.1 mg/dL (ref 8.9–10.3)
Chloride: 103 mmol/L (ref 98–111)
Creatinine, Ser: 0.57 mg/dL (ref 0.44–1.00)
GFR, Estimated: >60 mL/min (ref >60)
Glucose, Bld: 101 mg/dL — ABNORMAL HIGH (ref 70–99)
Potassium: 3.8 mmol/L (ref 3.5–5.1)
Sodium: 137 mmol/L (ref 135–145)
Total Bilirubin: 0.4 mg/dL (ref 0.3–1.2)
Total Protein: 7 g/dL (ref 6.5–8.1)

## 2021-09-09 LAB — I-STAT BETA HCG BLOOD, ED (MC, WL, AP ONLY): I-stat hCG, quantitative: <5 m[IU]/mL (ref <5)

## 2021-09-09 MED ORDER — ALUM & MAG HYDROXIDE-SIMETH 200-200-20 MG/5ML PO SUSP
30.0000 mL | Freq: Once | ORAL | Status: AC
  Administered 2021-09-09: 30 mL via ORAL
  Filled 2021-09-09: qty 30

## 2021-09-09 MED ORDER — SUCRALFATE 1 G PO TABS
1.0000 g | ORAL_TABLET | Freq: Three times a day (TID) | ORAL | 0 refills | Status: DC
  Filled 2021-09-16: qty 56, 14d supply, fill #0

## 2021-09-09 MED ORDER — LIDOCAINE VISCOUS HCL 2 % MT SOLN
15.0000 mL | OROMUCOSAL | 0 refills | Status: DC | PRN
  Filled 2021-09-16: qty 100, 7d supply, fill #0

## 2021-09-09 MED ORDER — LIDOCAINE VISCOUS HCL 2 % MT SOLN
15.0000 mL | Freq: Once | OROMUCOSAL | Status: DC
  Filled 2021-09-09: qty 15

## 2021-09-09 NOTE — ED Triage Notes (Signed)
Burning sensation in the abdomen, goes around to the back on both sides, did vomit this am, no diarrhea or cramping. Not able to eat and keep anything on her stomach ?

## 2021-09-09 NOTE — ED Provider Notes (Signed)
?[facility] EMERGENCY DEPARTMENT ?Provider Note ? ? ?CSN: [id number] ?Arrival date & time: 09/08/21  2325 ? ?  ? ?History ? ?Chief Complaint  ?Patient presents with  ? Abdominal Pain  ? ? ?Hayley Garcia is a 46 y.o. female. ? ?Patient with history of GERD presents today with complaints of abdominal pain. She states that same has been occurring intermittently for the past 6 months with this most recent episode occurring over the past month. She states that same is burning in nature and located in her epigastric region and radiates to her back.  She states that this is standard for her typical GERD flares, however she is normally able to manage with omeprazole and home but has been taking that as prescribed without relief of her symptoms.  She states that the burning feeling begins in her epigastric region and radiates up into her throat.  She endorses 1 episode of vomiting yesterday morning after drinking water with some intermittent nausea.  She endorses regular bowel movements without any diarrhea.  Last menstrual cycle was at the end of March.  She denies any dysuria or hematuria. ? ?Of note, it appears patient presented to GI in February for similar complaints and had normal EGD at that time. Did have positive H. Pylori on biopsy at that time, was treated with subsequent negative H. Pylori test ? ?The history is provided by the patient. A language interpreter was used.  ?Abdominal Pain ?Associated symptoms: no chest pain, no chills, no cough, no diarrhea, no dysuria, no fever, no nausea, no shortness of breath, no vaginal bleeding, no vaginal discharge and no vomiting   ? ?  ? ?Home Medications ?Prior to Admission medications   ?Medication Sig Start Date End Date Taking? Authorizing Provider  ?albuterol (VENTOLIN HFA) 108 (90 Base) MCG/ACT inhaler Inhale 2 puffs into the lungs every 6 (six) hours as needed for wheezing or shortness of breath. 07/29/21   Anders Simmonds, PA-C   ?cholecalciferol (VITAMIN D3) 25 MCG (1000 UNIT) tablet Take 2,000 Units by mouth daily.    [provider]  ?Nutritional Supplements (VITAMIN D BOOSTER PO) Take 100 mg by mouth daily. ?Patient not taking: Reported on 07/01/2021    [provider]  ?omeprazole (PRILOSEC) 20 MG capsule Take 1 capsule (20 mg total) by mouth 2 (two) times daily before a meal. 07/29/21   Anders Simmonds, PA-C  ?   ? ?Allergies    ?Patient has no known allergies.   ? ?Review of Systems   ?Review of Systems  ?Constitutional:  Negative for chills and fever.  ?Respiratory:  Negative for cough and shortness of breath.   ?Cardiovascular:  Negative for chest pain.  ?Gastrointestinal:  Positive for abdominal pain. Negative for diarrhea, nausea and vomiting.  ?Genitourinary:  Negative for decreased urine volume, difficulty urinating, dysuria, vaginal bleeding, vaginal discharge and vaginal pain.  ?All other systems reviewed and are negative. ? ?Physical Exam ?Updated Vital Signs ?BP 123/80 (BP Location: Left Arm)   Pulse 90   Temp 98.6 ?F (37 ?C) (Oral)   Resp 18   Ht 5\' 1"  (1.549 m)   Wt 55.3 kg   SpO2 100%   BMI 23.05 kg/m?  ?Physical Exam ?Vitals and nursing note reviewed.  ?Constitutional:   ?   General: She is not in acute distress. ?   Appearance: She is well-developed and normal weight. She is not ill-appearing, toxic-appearing or diaphoretic.  ?   Comments: Patient resting comfortably in  bed in no acute distress  ?HENT:  ?   Head: Normocephalic and atraumatic.  ?Cardiovascular:  ?   Rate and Rhythm: Normal rate and regular rhythm.  ?   Heart sounds: Normal heart sounds.  ?Pulmonary:  ?   Effort: Pulmonary effort is normal.  ?   Breath sounds: Normal breath sounds.  ?Abdominal:  ?   General: Abdomen is flat.  ?   Palpations: Abdomen is soft.  ?   Tenderness: There is abdominal tenderness in the epigastric area. There is no right CVA tenderness, left CVA tenderness, guarding or rebound. Negative signs include  Murphy's sign, Rovsing's sign, McBurney's sign, psoas sign and obturator sign.  ?Skin: ?   General: Skin is warm and dry.  ?Neurological:  ?   General: No focal deficit present.  ?   Mental Status: She is alert.  ?Psychiatric:     ?   Mood and Affect: Mood normal.     ?   Behavior: Behavior normal.  ? ? ?ED Results / Procedures / Treatments   ?Labs ?(all labs ordered are listed, but only abnormal results are displayed) ?Labs Reviewed  ?COMPREHENSIVE METABOLIC PANEL - Abnormal; Notable for the following components:  ?    Result Value  ? Glucose, Bld 101 (*)   ? All other components within normal limits  ?CBC WITH DIFFERENTIAL/PLATELET - Abnormal; Notable for the following components:  ? WBC 12.2 (*)   ? Neutro Abs 8.0 (*)   ? All other components within normal limits  ?URINALYSIS, ROUTINE W REFLEX MICROSCOPIC - Abnormal; Notable for the following components:  ? Color, Urine STRAW (*)   ? APPearance HAZY (*)   ? Hgb urine dipstick SMALL (*)   ? Bacteria, UA RARE (*)   ? All other components within normal limits  ?LIPASE, BLOOD  ?I-STAT BETA HCG BLOOD, ED ([facility], [facility], [facility] ONLY)  ? ? ?EKG ?None ? ?Radiology ?US Abdomen Limited ? ?Result Date: 09/09/2021 ?CLINICAL DATA:  Right upper quadrant pain. EXAM: ULTRASOUND ABDOMEN LIMITED RIGHT UPPER QUADRANT COMPARISON:  February 20, 2021 FINDINGS: Gallbladder: No gallstones or wall thickening visualized (2.3 mm). No sonographic Murphy sign noted by sonographer. Common bile duct: Diameter: 3.5 mm Liver: No focal lesion identified. Diffusely increased echogenicity of the liver parenchyma is noted. Portal vein is patent on color Doppler imaging with normal direction of blood flow towards the liver. Other: None. IMPRESSION: Hepatic steatosis without focal liver lesions. Electronically Signed   By: Aram Candela M.D.   On: 09/09/2021 01:11   ? ?Procedures ?Procedures  ? ? ?Medications Ordered in ED ?Medications  ?alum & mag hydroxide-simeth (MAALOX/MYLANTA) 200-200-20 MG/5ML  suspension 30 mL (has no administration in time range)  ?  And  ?lidocaine (XYLOCAINE) 2 % viscous mouth solution 15 mL (has no administration in time range)  ? ? ?ED Course/ Medical Decision Making/ A&P ?  ?                        ?Medical Decision Making ? ?This patient presents to the ED for concern of abdominal pain, this involves an extensive number of treatment options, and is a complaint that carries with it a high risk of complications and morbidity. ? ? ?Co morbidities that complicate the patient evaluation ? ?Hx of GERD ? ? ?Additional history obtained: ? ?Additional history obtained from EGD imaging and GI and PCP notes ? ? ?Lab Tests: ? ?I Ordered, and personally interpreted labs.  The  pertinent results include:  WBC 12.2, no other acute laboratory findings ? ? ?Imaging Studies ordered: ? ?I ordered imaging studies including RUQ US  ?I independently visualized and interpreted imaging which showed  ?Hepatic steatosis without focal liver lesions. ?I agree with the radiologist interpretation ? ? ? ?Problem List / ED Course / Critical interventions / Medication management ? ?I ordered medication including GI cocktail  for GERD  ?Reevaluation of the patient after these medicines showed that the patient resolved ?I have reviewed the patients home medicines and have made adjustments as needed ? ? ?Social Determinants of Health: ? ?Spanish speaking only ? ? ?Test / Admission - Considered: ? ?Considered CT abdomen, however patient states that her pain is completely resolved after GI cocktail. She also states that her pain is extremely similar to when she had CT scan last September without acute findings. ? ?Patient presents today with 1 month of abdominal pain. Patient is nontoxic, nonseptic appearing, in no apparent distress.  Patient's pain and other symptoms adequately managed in emergency department. Labs, imaging and vitals reviewed.  Patient does not meet the SIRS or Sepsis criteria.  On repeat exam patient  does not have a surgical abdomin and there are no peritoneal signs.  No indication of appendicitis, bowel obstruction, bowel perforation, cholecystitis, diverticulitis, PID or ectopic pregnancy. Suspect patients symptoms are relat

## 2021-09-09 NOTE — ED Provider Triage Note (Signed)
?  Emergency Medicine Provider Triage Evaluation Note ? ?MRN:  [id number]  ?Arrival date & time: 09/09/21    ?Medically screening exam initiated at 12:31 AM.   ?CC:   ?Abdominal Pain ?  ?HPI:  ?Hayley Garcia is a 46 y.o. year-old female presents to the ED with chief complaint of right sided abdominal pain x 1 day that radiates to the back.  Associated with vomiting.  Also reports burning sensation in chest.  She denies prior abdominal surgeries. ? ?History provided by History provided by patient. ?Spanish interpreter used ?ROS:  ?-As included in HPI ?PE:  ? ?Vitals:  ? 09/08/21 2339 09/09/21 0024  ?BP: (!) 132/92   ?Pulse: 98   ?Resp: 16   ?Temp: 98.5 ?F (36.9 ?C)   ?SpO2: 99% 99%  ?  ?Non-toxic appearing ?No respiratory distress ?RUQ TTP ?MDM:  ?Based on signs and symptoms, gallbladder disease is highest on my differential, followed by pancreatitis, GERD, PUD. ?I've ordered labs and imaging in triage to expedite lab/diagnostic workup. ? ?Patient was informed that the remainder of the evaluation will be completed by another provider, this initial triage assessment does not replace that evaluation, and the importance of remaining in the ED until their evaluation is complete. ? ?  ?Roxy Horseman, PA-C ?09/09/21 0033 ? ?

## 2021-09-09 NOTE — ED Notes (Signed)
PO challenge 75ml of meds/ GI cocktail ?

## 2021-09-09 NOTE — Discharge Instructions (Addendum)
Como discutimos, su evaluaci?n en la sala de emergencias fue tranquilizadora para las anomal?as [place]. Le he dado 2 medicamentos para ayudar con el manejo de sus s?ntomas de reflujo. T?melos seg?n lo prescrito. Tambi?n le he dado una remisi?n a su m?dico GI con un n?mero para llamar y programar una cita para una evaluaci?n adicional y el control de sus s?ntomas. Regresar si se desarrollan s?ntomas nuevos o que empeoran ?

## 2021-09-10 ENCOUNTER — Other Ambulatory Visit: Payer: Self-pay

## 2021-09-14 ENCOUNTER — Encounter: Payer: Self-pay | Admitting: Gastroenterology

## 2021-09-16 ENCOUNTER — Other Ambulatory Visit: Payer: Self-pay

## 2021-09-30 ENCOUNTER — Ambulatory Visit (INDEPENDENT_AMBULATORY_CARE_PROVIDER_SITE_OTHER): Payer: Self-pay | Admitting: Gastroenterology

## 2021-09-30 ENCOUNTER — Encounter: Payer: Self-pay | Admitting: Gastroenterology

## 2021-09-30 ENCOUNTER — Ambulatory Visit: Payer: Self-pay | Admitting: Gastroenterology

## 2021-09-30 ENCOUNTER — Other Ambulatory Visit: Payer: Self-pay

## 2021-09-30 VITALS — BP 102/64 | HR 80 | Ht 62.0 in | Wt 118.0 lb

## 2021-09-30 DIAGNOSIS — R1013 Epigastric pain: Secondary | ICD-10-CM | POA: Insufficient documentation

## 2021-09-30 DIAGNOSIS — K219 Gastro-esophageal reflux disease without esophagitis: Secondary | ICD-10-CM

## 2021-09-30 MED ORDER — PANTOPRAZOLE SODIUM 40 MG PO TBEC
40.0000 mg | DELAYED_RELEASE_TABLET | Freq: Every day | ORAL | 5 refills | Status: DC
  Filled 2021-09-30: qty 90, 90d supply, fill #0

## 2021-09-30 NOTE — Progress Notes (Signed)
Agree with the assessment and plan as outlined by Jessica Zehr, PA-C.  Hayley Garcia E. Kalandra Masters, MD  [facility]  

## 2021-09-30 NOTE — Progress Notes (Signed)
? ? ? ?09/30/2021 ?Hayley Garcia ?[id number] ?[birth year] ? ? ?HISTORY OF PRESENT ILLNESS: This is a 46 year old female who is a patient of Dr. Dayle Points.  She was seen by him in September 2022 with some upper GI complaints.  Was scheduled for EGD and colonoscopy, which never occurred until February 2023.  At that time both of the studies were normal.  Gastric biopsies showed reactive gastropathy with mild chronic gastritis, negative for H. pylori.  She had a positive H. pylori breath test previously and was treated with amoxicillin, clarithromycin, and omeprazole and repeat breath test a month later was negative.  She was on omeprazole at one point and then sounds like was changed to Protonix.  She reported to Dr. Candis Schatz back in September 2022 that the pantoprazole seemed to improve her pain.  She is now back on omeprazole 20 mg twice daily and was given a 14-day course of Carafate tablet.  She reports burning epigastric abdominal pain that radiates to both sides of her abdomen for the past couple of months. ? ?She had both ultrasound and CT scan performed back in September 2022 and then had another ultrasound recently that showed hepatic steatosis.  Recent CBC, CMP, and lipase were unremarkable. ? ?The entire visit was performed via interpreter/translator as she is primarily Spanish-speaking. ? ? ?Past Medical History:  ?Diagnosis Date  ? Anemia   ? hx of  ? Anxiety   ? not on meds at this time  ? Back pain   ? Chronic headaches   ? Continuous urine leakage   ? Cough   ? Excessive thirst   ? GERD (gastroesophageal reflux disease)   ? not on meds as of 06/17/2021  ? Hepatic steatosis   ? Menstrual pain   ? Night sweats   ? Pre-diabetes   ? Shortness of breath   ? Sore throat   ? Umbilical hernia   ? Vision changes   ? ?Past Surgical History:  ?Procedure Laterality Date  ? ANKLE SURGERY Left   ? pin  ? ? reports that she has never smoked. She has never used smokeless tobacco. She reports that she does  not currently use alcohol. She reports that she does not use drugs. ?family history is not on file. ?No Known Allergies ? ?  ?Outpatient Encounter Medications as of 09/30/2021  ?Medication Sig  ? albuterol (VENTOLIN HFA) 108 (90 Base) MCG/ACT inhaler Inhale 2 puffs into the lungs every 6 (six) hours as needed for wheezing or shortness of breath.  ? cholecalciferol (VITAMIN D3) 25 MCG (1000 UNIT) tablet Take 2,000 Units by mouth daily.  ? lidocaine (XYLOCAINE) 2 % solution Use as directed 15 mLs in the mouth or throat as needed for mouth pain.  ? Nutritional Supplements (VITAMIN D BOOSTER PO) Take 100 mg by mouth daily.  ? omeprazole (PRILOSEC) 20 MG capsule Take 1 capsule (20 mg total) by mouth 2 (two) times daily before a meal.  ? sucralfate (CARAFATE) 1 g tablet Take 1 tablet (1 g total) by mouth 4 (four) times daily -  with meals and at bedtime for 14 days.  ? ?No facility-administered encounter medications on file as of 09/30/2021.  ? ? ? ?REVIEW OF SYSTEMS  : All other systems reviewed and negative except where noted in the History of Present Illness. ? ? ?PHYSICAL EXAM: ?BP 102/64   Pulse 80   Ht 5\' 2"  (1.575 m)   Wt 118 lb (53.5 kg)   SpO2 97%  BMI 21.58 kg/m?  ?General: Well developed female in no acute distress ?Head: Normocephalic and atraumatic ?Eyes:  Sclerae anicteric, conjunctiva pink. ?Ears: Normal auditory acuity ?Lungs: Clear throughout to auscultation; no W/R/R. ?Heart: Regular rate and rhythm; no M/R/G. ?Abdomen: Soft, non-distended.  BS present.  Non-tender. ?Musculoskeletal: Symmetrical with no gross deformities  ?Skin: No lesions on visible extremities ?Extremities: No edema  ?Neurological: Alert oriented x 4, grossly non-focal ?Psychological:  Alert and cooperative. Normal mood and affect ? ?ASSESSMENT AND PLAN: ?*GERD with epigastric abdominal burning: Previously positive for H. pylori, but was successfully treated.  EGD in February was normal with gastric biopsy showing some chronic  gastritis.  Is on omeprazole 20 mg twice daily and Carafate was recently added for 14 days.  Previously she had been on pantoprazole and reported improvement with that.  I am going to discontinue her omeprazole and put her on pantoprazole 40 mg daily.  Prescription sent to her pharmacy.  We discussed a GERD diet and she was given this in [place].  She will follow-up here for ongoing symptoms. ? ? ?CC:  Charlott Rakes, MD ? ?  ?

## 2021-09-30 NOTE — Patient Instructions (Signed)
If you are age 46 or older, your body mass index should be between 23-30. Your Body mass index is 21.58 kg/m?Marland Kitchen If this is out of the aforementioned range listed, please consider follow up with your Primary Care Provider. ? ?If you are age 72 or younger, your body mass index should be between 19-25. Your Body mass index is 21.58 kg/m?Marland Kitchen If this is out of the aformentioned range listed, please consider follow up with your Primary Care Provider.  ? ?________________________________________________________ ? ?The  GI providers would like to encourage you to use [facility] to communicate with providers for non-urgent requests or questions.  Due to long hold times on the telephone, sending your provider a message by [facility] may be a faster and more efficient way to get a response.  Please allow 48 business hours for a response.  Please remember that this is for non-urgent requests.  ?_______________________________________________________ ?  ?Detenga el omeprazol y comience con Protonix, una tableta una vez al d?a. ? ?H?ganos saber si sus s?ntomas no mejoran. ? ?It was a pleasure to see you today! ? ?Thank you for trusting me with your gastrointestinal care!   ? ? ?

## 2021-10-13 ENCOUNTER — Ambulatory Visit: Payer: Self-pay | Admitting: Family Medicine

## 2021-10-26 ENCOUNTER — Ambulatory Visit: Payer: Self-pay | Admitting: Family Medicine

## 2021-11-25 ENCOUNTER — Ambulatory Visit: Payer: Self-pay | Attending: Family Medicine | Admitting: Family Medicine

## 2021-11-25 ENCOUNTER — Other Ambulatory Visit: Payer: Self-pay

## 2021-11-25 ENCOUNTER — Other Ambulatory Visit (HOSPITAL_COMMUNITY)
Admission: RE | Admit: 2021-11-25 | Discharge: 2021-11-25 | Disposition: A | Discharge status: Home / Self Care | Payer: Self-pay | Source: Ambulatory Visit | Attending: Family Medicine | Admitting: Family Medicine

## 2021-11-25 ENCOUNTER — Encounter: Payer: Self-pay | Admitting: Family Medicine

## 2021-11-25 VITALS — BP 124/82 | HR 71 | Ht 59.0 in | Wt 115.6 lb

## 2021-11-25 DIAGNOSIS — J302 Other seasonal allergic rhinitis: Secondary | ICD-10-CM

## 2021-11-25 DIAGNOSIS — K219 Gastro-esophageal reflux disease without esophagitis: Secondary | ICD-10-CM

## 2021-11-25 DIAGNOSIS — Z1159 Encounter for screening for other viral diseases: Secondary | ICD-10-CM

## 2021-11-25 DIAGNOSIS — Z124 Encounter for screening for malignant neoplasm of cervix: Secondary | ICD-10-CM | POA: Insufficient documentation

## 2021-11-25 DIAGNOSIS — Z1231 Encounter for screening mammogram for malignant neoplasm of breast: Secondary | ICD-10-CM

## 2021-11-25 DIAGNOSIS — Z0000 Encounter for general adult medical examination without abnormal findings: Secondary | ICD-10-CM

## 2021-11-25 MED ORDER — CETIRIZINE HCL 10 MG PO TABS
10.0000 mg | ORAL_TABLET | Freq: Every day | ORAL | 1 refills | Status: DC
  Filled 2021-11-25: qty 30, 30d supply, fill #0

## 2021-11-25 NOTE — Patient Instructions (Addendum)
Hayley Garcia  Mantenimiento de la salud en las mujeres Health Maintenance, Female Adoptar un estilo de vida saludable y recibir atencin preventiva son importantes para promover la salud y Counsellor. Consulte al mdico sobre: El esquema adecuado para hacerse pruebas y exmenes peridicos. Cosas que puede hacer por su cuenta para prevenir enfermedades y [place] sano. Qu debo saber sobre la dieta, el peso y el ejercicio? Consuma una dieta saludable  Consuma una dieta que incluya muchas verduras, frutas, productos lcteos con bajo contenido de [place] y Associate Professor. No consuma muchos alimentos ricos en grasas slidas, azcares agregados o sodio. Mantenga un peso saludable El ndice de masa muscular [facility]) se [place] para identificar problemas de [place]. Proporciona una estimacin de la grasa corporal basndose en el peso y la altura. Su mdico puede ayudarle a Engineer, site IMC y a Personnel officer o Pharmacologist un peso saludable. Haga ejercicio con regularidad Haga ejercicio con regularidad. Esta es una de las prcticas ms importantes que puede hacer por su salud. La [employer] de los adultos deben seguir estas pautas: Education officer, environmental, al menos, 150 minutos de actividad fsica por semana. El ejercicio debe aumentar la frecuencia cardaca y Media planner transpirar (ejercicio de intensidad moderada). Hacer ejercicios de fortalecimiento por lo [employer] por semana. Agregue esto a su plan de ejercicio de intensidad moderada. Pase menos tiempo sentada. Incluso la actividad fsica ligera puede ser beneficiosa. Controle sus niveles de colesterol y lpidos en la sangre Comience a realizarse anlisis de lpidos y Oncologist en la sangre a los 20 aos y luego reptalos cada 5 aos. Hgase controlar los niveles de colesterol con mayor frecuencia si: Sus niveles de lpidos y colesterol son altos. Es mayor de 40 aos. Presenta un alto riesgo de padecer enfermedades cardacas. Qu debo saber sobre las pruebas de deteccin del  cncer? Segn su historia clnica y sus antecedentes familiares, es posible que deba realizarse pruebas de deteccin del cncer en diferentes edades. Esto puede incluir pruebas de deteccin de lo siguiente: Cncer de mama. Cncer de cuello uterino. Cncer colorrectal. Cncer de piel. Cncer de pulmn. Qu debo saber sobre la enfermedad cardaca, la diabetes y la hipertensin arterial? Presin arterial y enfermedad cardaca La hipertensin arterial causa enfermedades cardacas y [place] el riesgo de accidente cerebrovascular. Es ms probable que esto se manifieste en las personas que tienen lecturas de presin arterial alta o tienen sobrepeso. Hgase controlar la presin arterial: Cada 3 a 5 aos si tiene entre 18 y 74 aos. Todos los aos si es mayor de 40 aos. Diabetes Realcese exmenes de deteccin de la diabetes con regularidad. Este anlisis revisa el nivel de azcar en la sangre en [place]. Hgase las pruebas de deteccin: Cada tres aos despus de los 40 aos de edad si tiene un peso normal y un bajo riesgo de padecer diabetes. Con ms frecuencia y a partir de [place] edad inferior si tiene sobrepeso o un alto riesgo de padecer diabetes. Qu debo saber sobre la prevencin de infecciones? Hepatitis B Si tiene un riesgo ms alto de contraer hepatitis B, debe someterse a un examen de deteccin de este virus. Hable con el mdico para averiguar si tiene riesgo de contraer la infeccin por hepatitis B. Hepatitis C Se recomienda el anlisis a: [employer] 1945 y 1965. Todas las personas que tengan un riesgo de haber contrado hepatitis C. Enfermedades de transmisin sexual (ETS) Hgase las pruebas de Airline pilot de ITS, incluidas la gonorrea y la clamidia, si: Es sexualmente Printmaker y es Adult nurse  de 24 aos. Es mayor de [address], y Public affairs consultant informa que corre riesgo de tener este tipo de infecciones. La actividad sexual ha cambiado desde que le hicieron la ltima prueba de  deteccin y tiene un riesgo mayor de Warehouse manager clamidia o Copy. Pregntele al mdico si usted tiene riesgo. Pregntele al mdico si usted tiene un alto riesgo de Primary school teacher VIH. El mdico tambin puede recomendarle un medicamento recetado para ayudar a evitar la infeccin por el VIH. Si elige tomar medicamentos para prevenir el VIH, primero debe [employer] de deteccin del VIH. Luego debe hacerse anlisis cada 3 meses mientras est tomando los medicamentos. Embarazo Si est por dejar de Armed forces training and education officer (fase premenopusica) y usted puede quedar [place], busque asesoramiento antes de [place]. [place] de 400 a 800 microgramos (mcg) de cido [employer] si [place]. Pida mtodos de control de la natalidad (anticonceptivos) si desea evitar un embarazo no deseado. Osteoporosis y [place] La osteoporosis es una enfermedad en la que los huesos pierden los minerales y la fuerza por el avance de la edad. El resultado pueden ser fracturas en los [place]. Si tiene 65 aos o ms, o si est en riesgo de sufrir osteoporosis y fracturas, pregunte a su mdico si debe: Hacerse pruebas de deteccin de prdida sea. Tomar un suplemento de calcio o de vitamina D para reducir el riesgo de fracturas. Recibir terapia de reemplazo hormonal (TRH) para tratar los sntomas de la menopausia. Siga estas indicaciones en su casa: Consumo de alcohol No beba alcohol si: Su mdico le indica no hacerlo. Est embarazada, puede estar embarazada o est tratando de [place]. Si bebe alcohol: Limite la cantidad que bebe a lo siguiente: De 0 a 1 bebida por da. Sepa cunta cantidad de alcohol hay en las bebidas que toma. En los [address], una medida equivale a una botella de cerveza de 12 oz (355 ml), un vaso de vino de 5 oz (148 ml) o un vaso de una bebida alcohlica de alta graduacin de 1 oz (44 ml). Estilo de vida No consuma ningn producto que contenga nicotina o tabaco. Estos  productos incluyen cigarrillos, tabaco para Theatre manager y aparatos de vapeo, como los Administrator, Civil Service. Si necesita ayuda para dejar de consumir estos productos, consulte al mdico. No consuma drogas. No comparta agujas. Solicite ayuda a su mdico si necesita apoyo o informacin para abandonar las drogas. Indicaciones generales Realcese los estudios de rutina de [address], dentales y de Wellsite geologist. Mantngase al da con las vacunas. Infrmele a su mdico si: Se siente deprimida con frecuencia. Alguna vez ha sido vctima de [place] o no se siente seguro en su casa. Resumen Adoptar un estilo de vida saludable y recibir atencin preventiva son importantes para promover la salud y Counsellor. Siga las instrucciones del mdico acerca de una dieta saludable, el ejercicio y la realizacin de pruebas o exmenes para Hotel manager. Siga las instrucciones del mdico con respecto al control del colesterol y la presin arterial. Esta informacin no tiene Theme park manager el consejo del mdico. Asegrese de hacerle al mdico cualquier pregunta que tenga. Document Revised: 10/22/2020 Document Reviewed: 10/22/2020 Elsevier Patient Education  2023 [employer].

## 2021-11-25 NOTE — Progress Notes (Signed)
Pt states that her omeprazole is not working still

## 2021-11-25 NOTE — Progress Notes (Signed)
Subjective:  Patient ID: Hayley Garcia, female    DOB: 19-Apr-1976  Age: 46 y.o. MRN: [id number]  CC: Cough (Pt state coughing blood ) and Gastroesophageal Reflux   HPI Hayley Garcia is a 46 y.o. year old female with a history of H. pylori gastritis, GERD here for a complete physical exam She is due for her breast cancer, cervical cancer screening. Last colonoscopy was in 06/2021 with recommendations to repeat in 10 years.  Interval History:  She complains her omeprazole is not working as she continues to have reflux symptoms.  She is followed by [facility] with her last visit last month.  She has no nausea, vomiting, diarrhea or constipation. Complains that she has had some coughing in the early mornings and sometimes has blood-tinged sputum.  Denies presence of hematemesis. Past Medical History:  Diagnosis Date   Anemia    hx of   Anxiety    not on meds at this time   Back pain    Chronic headaches    Continuous urine leakage    Cough    Excessive thirst    GERD (gastroesophageal reflux disease)    not on meds as of 06/17/2021   Hepatic steatosis    Menstrual pain    Night sweats    Pre-diabetes    Shortness of breath    Sore throat    Umbilical hernia    Vision changes     Past Surgical History:  Procedure Laterality Date   ANKLE SURGERY Left    pin    Family History  Problem Relation Age of Onset   Colon polyps Neg Hx    Colon cancer Neg Hx    Esophageal cancer Neg Hx    Rectal cancer Neg Hx    Stomach cancer Neg Hx     Social History   Socioeconomic History   Marital status: Single    Spouse name: Not on file   Number of children: 2   Years of education: Not on file   Highest education level: Not on file  Occupational History   Occupation: Programmer, applications  Tobacco Use   Smoking status: Never   Smokeless tobacco: Never  Vaping Use   Vaping Use: Never used  Substance and Sexual Activity   Alcohol use: Not Currently   Drug use:  Never   Sexual activity: Not Currently  Other Topics Concern   Not on file  Social History Narrative   Not on file   Social Determinants of Health   Financial Resource Strain: Not on file  Food Insecurity: Not on file  Transportation Needs: Not on file  Physical Activity: Not on file  Stress: Not on file  Social Connections: Not on file    No Known Allergies  Outpatient Medications Prior to Visit  Medication Sig Dispense Refill   albuterol (VENTOLIN HFA) 108 (90 Base) MCG/ACT inhaler Inhale 2 puffs into the lungs every 6 (six) hours as needed for wheezing or shortness of breath. 18 g 2   lidocaine (XYLOCAINE) 2 % solution Use as directed 15 mLs in the mouth or throat as needed for mouth pain. 100 mL 0   Nutritional Supplements (VITAMIN D BOOSTER PO) Take 100 mg by mouth daily.     omeprazole (PRILOSEC) 20 MG capsule Take 1 capsule (20 mg total) by mouth 2 (two) times daily before a meal. 60 capsule 3   cholecalciferol (VITAMIN D3) 25 MCG (1000 UNIT) tablet Take 2,000 Units by mouth daily. (  Patient not taking: Reported on 11/25/2021)     pantoprazole (PROTONIX) 40 MG tablet Take 1 tablet (40 mg total) by mouth daily. (Patient not taking: Reported on 11/25/2021) 30 tablet 5   sucralfate (CARAFATE) 1 g tablet Take 1 tablet (1 g total) by mouth 4 (four) times daily -  with meals and at bedtime for 14 days. 56 tablet 0   No facility-administered medications prior to visit.     ROS Review of Systems  Constitutional:  Negative for activity change and appetite change.  HENT:  Negative for sinus pressure and sore throat.   Respiratory:  Positive for cough. Negative for chest tightness, shortness of breath and wheezing.   Cardiovascular:  Negative for chest pain and palpitations.  Gastrointestinal:  Negative for abdominal distention, abdominal pain and constipation.  Genitourinary: Negative.   Musculoskeletal: Negative.   Psychiatric/Behavioral:  Negative for behavioral problems and  dysphoric mood.     Objective:  BP 124/82   Pulse 71   Ht 4\' 11"  (1.499 m)   Wt 115 lb 9.6 oz (52.4 kg)   SpO2 99%   BMI 23.35 kg/m      11/25/2021    9:18 AM 09/30/2021   11:08 AM 09/09/2021    7:12 PM  BP/Weight  Systolic BP 124 102 118  Diastolic BP 82 64 87  Wt. (Lbs) 115.6 118   BMI 23.35 kg/m2 21.58 kg/m2       Physical Exam Exam conducted with a chaperone present.  Constitutional:      General: She is not in acute distress.    Appearance: She is well-developed. She is not diaphoretic.  HENT:     Head: Normocephalic.     Right Ear: External ear normal. There is impacted cerumen.     Left Ear: External ear normal. There is impacted cerumen.     Nose: Nose normal.  Eyes:     Conjunctiva/sclera: Conjunctivae normal.     Pupils: Pupils are equal, round, and reactive to light.  Neck:     Vascular: No JVD.  Cardiovascular:     Rate and Rhythm: Normal rate and regular rhythm.     Heart sounds: Normal heart sounds. No murmur heard.    No gallop.  Pulmonary:     Effort: Pulmonary effort is normal. No respiratory distress.     Breath sounds: Normal breath sounds. No wheezing or rales.  Chest:     Chest wall: No tenderness.  Breasts:    Right: Normal. No mass, nipple discharge or tenderness.     Left: Normal. No mass, nipple discharge or tenderness.  Abdominal:     General: Bowel sounds are normal. There is no distension.     Palpations: Abdomen is soft. There is no mass.     Tenderness: There is no abdominal tenderness.     Hernia: There is no hernia in the left inguinal area or right inguinal area.  Genitourinary:    General: Normal vulva.     Pubic Area: No rash.      Labia:        Right: No rash.        Left: No rash.      Vagina: Normal.     Cervix: Normal.     Uterus: Normal.      Adnexa: Right adnexa normal and left adnexa normal.       Right: No tenderness.         Left: No tenderness.    Musculoskeletal:  General: No tenderness. Normal  range of motion.     Cervical back: Normal range of motion. No tenderness.  Lymphadenopathy:     Upper Body:     Right upper body: No supraclavicular or axillary adenopathy.     Left upper body: No supraclavicular or axillary adenopathy.  Skin:    General: Skin is warm and dry.  Neurological:     Mental Status: She is alert and oriented to person, place, and time.     Deep Tendon Reflexes: Reflexes are normal and symmetric.        Latest Ref Rng & Units 09/09/2021   12:42 AM 02/20/2021    6:08 PM 08/12/2020    2:39 PM  CMP  Glucose 70 - 99 mg/dL 182  92  82   BUN 6 - 20 mg/dL 9  15  9    Creatinine 0.44 - 1.00 mg/dL  9.93  7.16   Sodium 135 - 145 mmol/L 137  138  138   Potassium 3.5 - 5.1 mmol/L 3.8  4.1  4.3   Chloride 98 - 111 mmol/L 103  104  101   CO2 22 - 32 mmol/L 27  26  21    Calcium 8.9 - 10.3 mg/dL 9.1  8.9  9.4   Total Protein 6.5 - 8.1 g/dL 7.0  6.9  7.6   Total Bilirubin 0.3 - 1.2 mg/dL 0.4  0.3  0.3   Alkaline Phos 38 - 126 U/L 76  70  83   AST 15 - 41 U/L 16  16  16    ALT 0 - 44 U/L 13  15  7      Lipid Panel  No results found for: "CHOL", "TRIG", "HDL", "CHOLHDL", "VLDL", "LDLCALC", "LDLDIRECT"  CBC    Component Value Date/Time   WBC 12.2 (H) 09/09/2021 0042   RBC 4.73 09/09/2021 0042   HGB 13.3 09/09/2021 0042   HGB 15.1 08/12/2020 1439   HCT 41.6 09/09/2021 0042   HCT 46.5 08/12/2020 1439   PLT 333 09/09/2021 0042   PLT 338 08/12/2020 1439   MCV 87.9 09/09/2021 0042   MCV 87 08/12/2020 1439   MCH 28.1 09/09/2021 0042   MCHC 32.0 09/09/2021 0042   RDW 14.0 09/09/2021 0042   RDW 13.2 08/12/2020 1439   LYMPHSABS 2.8 09/09/2021 0042   LYMPHSABS 2.1 08/12/2020 1439   MONOABS 0.9 09/09/2021 0042   EOSABS 0.4 09/09/2021 0042   EOSABS 0.4 08/12/2020 1439   BASOSABS 0.1 09/09/2021 0042   BASOSABS 0.1 08/12/2020 1439    Lab Results  Component Value Date   HGBA1C 5.9 (H) 08/12/2020    Assessment & Plan:  1. Annual physical exam Counseled on  150 minutes of exercise per week, healthy eating (including decreased daily intake of saturated fats, cholesterol, added sugars, sodium), STI prevention, routine healthcare maintenance.  2. Encounter for screening mammogram for malignant neoplasm of breast  - [facility] DIGITAL SCREENING TOMO BILATERAL; Future  3. Screening for cervical cancer - Cytology - PAP  4. Gastroesophageal reflux disease without esophagitis Uncontrolled on PPI Status post EGD Previously treated for H. pylori gastritis Advised to get in touch with her gastroenterologist  5. Seasonal allergies Could explain her blood-tinged sputum Placed on Zyrtec  6. Screening for viral disease - HCV Ab w Reflex to Quant PCR - HIV Antibody (routine testing w rflx)    No orders of the defined types were placed in this encounter.   Follow-up: Return in about 1 year (around  11/26/2022) for CPE/ Preventive Health Exam.       Hoy Register, MD, FAAFP. [facility] [place], [place] [phone]   11/25/2021, 10:13 AM

## 2021-11-26 LAB — HIV ANTIBODY (ROUTINE TESTING W REFLEX): HIV Screen 4th Generation wRfx: NONREACTIVE

## 2021-11-26 LAB — HCV INTERPRETATION

## 2021-11-26 LAB — HCV AB W REFLEX TO QUANT PCR: HCV Ab: NONREACTIVE

## 2021-12-01 LAB — CYTOLOGY - PAP
Comment: NEGATIVE
Diagnosis: NEGATIVE
Diagnosis: REACTIVE
High risk HPV: NEGATIVE

## 2021-12-02 ENCOUNTER — Telehealth: Payer: Self-pay | Admitting: Emergency Medicine

## 2021-12-02 NOTE — Telephone Encounter (Signed)
No message came with the encounter.

## 2021-12-02 NOTE — Telephone Encounter (Signed)
Call routed to CMA.

## 2021-12-08 ENCOUNTER — Other Ambulatory Visit: Payer: Self-pay

## 2021-12-09 ENCOUNTER — Ambulatory Visit: Payer: Self-pay

## 2021-12-09 NOTE — Telephone Encounter (Signed)
  Chief Complaint: Depression  Symptoms: crying, lethargy, frustration Frequency: ongoing for months Pertinent Negatives: Patient denies self harm, harm to others Disposition: [] ED /[x] Urgent Care (no appt availability in office) / [] Appointment(In office/virtual)/ []  [facility]/ [] Home Care/ [] Refused Recommended Disposition /[] [facility]/ []  Follow-up with PCP Additional Notes: PT has been depressed for several months. She has no support system in place. She crys a lot and is frustrated. Pt will see care with behavioral health and keep upcoming  appt with Dr. . Pt also requested labs results - shared provider's note with pt. Reason for Disposition  [1] Depression AND [2] worsening (e.g.,sleeping poorly, less able to do activities of daily living)  Answer Assessment - Initial Assessment Questions 1. CONCERN: "What happened that made you call today?"     nothing 2. DEPRESSION SYMPTOM SCREENING: "How are you feeling overall?" (e.g., decreased energy, increased sleeping or difficulty sleeping, difficulty concentrating, feelings of sadness, guilt, hopelessness, or worthlessness)     Does not want to do anything, crying 3. RISK OF HARM - SUICIDAL IDEATION:  "Do you ever have thoughts of hurting or killing yourself?"  (e.g., yes, no, no but preoccupation with thoughts about death)   - INTENT:  "Do you have thoughts of hurting or killing yourself right NOW?" (e.g., yes, no, N/A)   - PLAN: "Do you have a specific plan for how you would do this?" (e.g., gun, knife, overdose, no plan, N/A)     no 4. RISK OF HARM - HOMICIDAL IDEATION:  "Do you ever have thoughts of hurting or killing someone else?"  (e.g., yes, no, no but preoccupation with thoughts about death)   - INTENT:  "Do you have thoughts of hurting or killing someone right NOW?" (e.g., yes, no, N/A)   - PLAN: "Do you have a specific plan for how you would do this?" (e.g., gun, knife, no plan, N/A)      no 5.  FUNCTIONAL IMPAIRMENT: "How have things been going for you overall? Have you had more difficulty than usual doing your normal daily activities?"  (e.g., better, same, worse; self-care, school, work, interactions)     worse 6. SUPPORT: "Who is with you now?" "Who do you live with?" "Do you have family or friends who you can talk to?"      No one 7. THERAPIST: "Do you have a counselor or therapist? Name?"     no 8. STRESSORS: "Has there been any new stress or recent changes in your life?"     46 year old 79. ALCOHOL USE OR SUBSTANCE USE (DRUG USE): "Do you drink alcohol or use any illegal drugs?"     no 10. OTHER: "Do you have any other physical symptoms right now?" (e.g., fever)       Crying frustration - gastritis. 11. PREGNANCY: "Is there any chance you are pregnant?" "When was your last menstrual period?"       no  Protocols used: Depression-A-AH

## 2021-12-09 NOTE — Telephone Encounter (Signed)
Pt was given first available appointment

## 2022-01-13 ENCOUNTER — Other Ambulatory Visit: Payer: Self-pay

## 2022-01-13 ENCOUNTER — Encounter: Payer: Self-pay | Admitting: Family Medicine

## 2022-01-13 ENCOUNTER — Ambulatory Visit: Payer: Self-pay | Attending: Family Medicine | Admitting: Family Medicine

## 2022-01-13 VITALS — BP 108/73 | HR 82 | Temp 98.2°F | Ht 59.0 in | Wt 118.2 lb

## 2022-01-13 DIAGNOSIS — K219 Gastro-esophageal reflux disease without esophagitis: Secondary | ICD-10-CM

## 2022-01-13 DIAGNOSIS — R062 Wheezing: Secondary | ICD-10-CM

## 2022-01-13 DIAGNOSIS — R0982 Postnasal drip: Secondary | ICD-10-CM

## 2022-01-13 MED ORDER — ALBUTEROL SULFATE HFA 108 (90 BASE) MCG/ACT IN AERS
2.0000 | INHALATION_SPRAY | Freq: Four times a day (QID) | RESPIRATORY_TRACT | 2 refills | Status: DC | PRN
  Filled 2022-01-13: qty 18, 25d supply, fill #0

## 2022-01-13 MED ORDER — FLUTICASONE PROPIONATE 50 MCG/ACT NA SUSP
2.0000 | Freq: Every day | NASAL | 1 refills | Status: DC
  Filled 2022-01-13: qty 16, 30d supply, fill #0

## 2022-01-13 MED ORDER — CETIRIZINE HCL 10 MG PO TABS
10.0000 mg | ORAL_TABLET | Freq: Every day | ORAL | 1 refills | Status: DC
  Filled 2022-01-13: qty 30, 30d supply, fill #0

## 2022-01-13 NOTE — Progress Notes (Signed)
Subjective:  Patient ID: Hayley Garcia, female    DOB: 1976/01/19  Age: 46 y.o. MRN: [id number]  CC: ABDOMINAL BURNING   HPI Hayley Garcia is a 46 y.o. year old female with a history of H. pylori gastritis, GERD who presents for an office visit.  Interval History: She complains of abdominal burning and states her medication is ineffective.  Currently on omeprazole and was last seen by [facility] 3 months ago.  At her last visit she had complained of abdominal pain and had been advised to contact her GI for an appointment but she never did.  She Complains of throat hurting and a 'whistling' sensation in her lungs which has been present x 4 months and she sometimes has dyspnea. Describes this as a poking sensation in her throat.She has no underlying chronic lung disease and does not smoke. She sometimes has post nasal drip Past Medical History:  Diagnosis Date   Anemia    hx of   Anxiety    not on meds at this time   Back pain    Chronic headaches    Continuous urine leakage    Cough    Excessive thirst    GERD (gastroesophageal reflux disease)    not on meds as of 06/17/2021   Hepatic steatosis    Menstrual pain    Night sweats    Pre-diabetes    Shortness of breath    Sore throat    Umbilical hernia    Vision changes     Past Surgical History:  Procedure Laterality Date   ANKLE SURGERY Left    pin    Family History  Problem Relation Age of Onset   Colon polyps Neg Hx    Colon cancer Neg Hx    Esophageal cancer Neg Hx    Rectal cancer Neg Hx    Stomach cancer Neg Hx     Social History   Socioeconomic History   Marital status: Single    Spouse name: Not on file   Number of children: 2   Years of education: Not on file   Highest education level: Not on file  Occupational History   Occupation: Programmer, applications  Tobacco Use   Smoking status: Never   Smokeless tobacco: Never  Vaping Use   Vaping Use: Never used  Substance and Sexual  Activity   Alcohol use: Not Currently   Drug use: Never   Sexual activity: Not Currently  Other Topics Concern   Not on file  Social History Narrative   Not on file   Social Determinants of Health   Financial Resource Strain: Not on file  Food Insecurity: Not on file  Transportation Needs: Not on file  Physical Activity: Not on file  Stress: Not on file  Social Connections: Not on file    No Known Allergies  Outpatient Medications Prior to Visit  Medication Sig Dispense Refill   omeprazole (PRILOSEC) 20 MG capsule Take 1 capsule (20 mg total) by mouth 2 (two) times daily before a meal. 60 capsule 3   sucralfate (CARAFATE) 1 g tablet Take 1 tablet (1 g total) by mouth 4 (four) times daily -  with meals and at bedtime for 14 days. 56 tablet 0   albuterol (VENTOLIN HFA) 108 (90 Base) MCG/ACT inhaler Inhale 2 puffs into the lungs every 6 (six) hours as needed for wheezing or shortness of breath. (Patient not taking: Reported on 01/13/2022) 18 g 2   cetirizine (ZYRTEC) 10  MG tablet Take 1 tablet (10 mg total) by mouth daily. (Patient not taking: Reported on 01/13/2022) 30 tablet 1   cholecalciferol (VITAMIN D3) 25 MCG (1000 UNIT) tablet Take 2,000 Units by mouth daily. (Patient not taking: Reported on 11/25/2021)     lidocaine (XYLOCAINE) 2 % solution Use as directed 15 mLs in the mouth or throat as needed for mouth pain. (Patient not taking: Reported on 01/13/2022) 100 mL 0   Nutritional Supplements (VITAMIN D BOOSTER PO) Take 100 mg by mouth daily. (Patient not taking: Reported on 01/13/2022)     pantoprazole (PROTONIX) 40 MG tablet Take 1 tablet (40 mg total) by mouth daily. (Patient not taking: Reported on 11/25/2021) 30 tablet 5   No facility-administered medications prior to visit.     ROS Review of Systems  Constitutional:  Negative for activity change and appetite change.  HENT:  Positive for postnasal drip and sore throat. Negative for sinus pressure.   Respiratory:  Positive  for wheezing. Negative for chest tightness and shortness of breath.   Cardiovascular:  Negative for chest pain and palpitations.  Gastrointestinal:  Positive for abdominal pain. Negative for abdominal distention and constipation.  Genitourinary: Negative.   Musculoskeletal: Negative.   Psychiatric/Behavioral:  Negative for behavioral problems and dysphoric mood.     Objective:  BP 108/73   Pulse 82   Temp 98.2 F (36.8 C) (Oral)   Ht 4\' 11"  (1.499 m)   Wt 118 lb 3.2 oz (53.6 kg)   SpO2 99%   BMI 23.87 kg/m      01/13/2022    9:32 AM 11/25/2021    9:18 AM 09/30/2021   11:08 AM  BP/Weight  Systolic BP 108 124 102  Diastolic BP 73 82 64  Wt. (Lbs) 118.2 115.6 118  BMI 23.87 kg/m2 23.35 kg/m2 21.58 kg/m2      Physical Exam Constitutional:      Appearance: She is well-developed.  HENT:     Mouth/Throat:     Comments: Slight erythema of oropharynx Cardiovascular:     Rate and Rhythm: Normal rate.     Heart sounds: Normal heart sounds. No murmur heard. Pulmonary:     Effort: Pulmonary effort is normal.     Breath sounds: Normal breath sounds. No wheezing or rales.  Chest:     Chest wall: No tenderness.  Abdominal:     General: Bowel sounds are normal. There is no distension.     Palpations: Abdomen is soft. There is no mass.     Tenderness: There is no abdominal tenderness.  Musculoskeletal:        General: Normal range of motion.     Right lower leg: No edema.     Left lower leg: No edema.  Neurological:     Mental Status: She is alert and oriented to person, place, and time.  Psychiatric:        Mood and Affect: Mood normal.        Latest Ref Rng & Units 09/09/2021   12:42 AM 02/20/2021    6:08 PM 08/12/2020    2:39 PM  CMP  Glucose 70 - 99 mg/dL 08/14/2020  92  82   BUN 6 - 20 mg/dL 9  15  9    Creatinine 0.44 - 1.00 mg/dL 563   8.93   Sodium 135 - 145 mmol/L 137  138  138   Potassium 3.5 - 5.1 mmol/L 3.8  4.1  4.3   Chloride 98 - 111 mmol/L  103  104  101    CO2 22 - 32 mmol/L 27  26  21    Calcium 8.9 - 10.3 mg/dL 9.1  8.9  9.4   Total Protein 6.5 - 8.1 g/dL 7.0  6.9  7.6   Total Bilirubin 0.3 - 1.2 mg/dL 0.4  0.3  0.3   Alkaline Phos 38 - 126 U/L 76  70  83   AST 15 - 41 U/L 16  16  16    ALT 0 - 44 U/L 13  15  7      Lipid Panel  No results found for: "CHOL", "TRIG", "HDL", "CHOLHDL", "VLDL", "LDLCALC", "LDLDIRECT"  CBC    Component Value Date/Time   WBC 12.2 (H) 09/09/2021 0042   RBC 4.73 09/09/2021 0042   HGB 13.3 09/09/2021 0042   HGB 15.1 08/12/2020 1439   HCT 41.6 09/09/2021 0042   HCT 46.5 08/12/2020 1439   PLT 333 09/09/2021 0042   PLT 338 08/12/2020 1439   MCV 87.9 09/09/2021 0042   MCV 87 08/12/2020 1439   MCH 28.1 09/09/2021 0042   MCHC 32.0 09/09/2021 0042   RDW 14.0 09/09/2021 0042   RDW 13.2 08/12/2020 1439   LYMPHSABS 2.8 09/09/2021 0042   LYMPHSABS 2.1 08/12/2020 1439   MONOABS 0.9 09/09/2021 0042   EOSABS 0.4 09/09/2021 0042   EOSABS 0.4 08/12/2020 1439   BASOSABS 0.1 09/09/2021 0042   BASOSABS 0.1 08/12/2020 1439    Lab Results  Component Value Date   HGBA1C 5.9 (H) 08/12/2020    Assessment & Plan:  1. Post-nasal drip Could explain sore throat and poking sensation which she is describing - cetirizine (ZYRTEC) 10 MG tablet; Take 1 tablet (10 mg total) by mouth daily.  Dispense: 30 tablet; Refill: 1 - fluticasone (FLONASE) 50 MCG/ACT nasal spray; Place 2 sprays into both nostrils daily.  Dispense: 16 g; Refill: 1  2. Wheezing No wheezing noted on my exam It seems like she was previously on albuterol and I will just refill that for her - albuterol (VENTOLIN HFA) 108 (90 Base) MCG/ACT inhaler; Inhale 2 puffs into the lungs every 6 (six) hours as needed for wheezing or shortness of breath.  Dispense: 18 g; Refill: 2  3. Gastroesophageal reflux disease without esophagitis Underlying history of H. pylori gastritis which was treated by GI previously Uncontrolled on PPI therapy Provided with number to  her gastroenterologist to schedule an appointment.   Meds ordered this encounter  Medications   albuterol (VENTOLIN HFA) 108 (90 Base) MCG/ACT inhaler    Sig: Inhale 2 puffs into the lungs every 6 (six) hours as needed for wheezing or shortness of breath.    Dispense:  18 g    Refill:  2   cetirizine (ZYRTEC) 10 MG tablet    Sig: Take 1 tablet (10 mg total) by mouth daily.    Dispense:  30 tablet    Refill:  1   fluticasone (FLONASE) 50 MCG/ACT nasal spray    Sig: Place 2 sprays into both nostrils daily.    Dispense:  16 g    Refill:  1    Follow-up: Return for follow up, keep previously scheduled appointment.       09/11/2021, MD, FAAFP. [facility] [place], [place] [facility]   01/13/2022, 9:56 AM

## 2022-01-13 NOTE — Patient Instructions (Signed)
Gastritis en adultos Gastritis, Adult La gastritis es irritacin e hinchazn (inflamacin) del estmago. Hay dos tipos de gastritis: Gastritis aguda. Este tipo se desarrolla rpidamente. Gastritis crnica. Este tipo es mucho ms frecuente. Se desarrolla lentamente y dura un largo tiempo. Es importante recibir ayuda para esta afeccin. Si no obtiene ayuda, el estmago puede sangrar y puede desarrollar llagas (lceras) en el estmago. Cules son las causas? Esta afeccin puede ser causada por lo siguiente: Grmenes que llegan al estmago y provocan una infeccin. Beber alcohol en exceso. Los medicamentos que toma. Tener demasiado cido en el estmago. Tener una enfermedad del estmago. Algunas otras causas son las siguientes: Una reaccin alrgica. Algunos tratamientos para el cncer (radiacin). Fumar cigarrillos o usar productos que contienen nicotina o tabaco. En algunos casos, se desconoce la causa de esta afeccin. Qu incrementa el riesgo? Tener una enfermedad de los intestinos. Tener enfermedad de Crohn. Usar aspirina o ibuprofeno y otros antiinflamatorios no esteroideos (AINE) para tratar otras afecciones. Estrs. Cules son los signos o sntomas? Dolor en el estmago. Una sensacin de ardor en el estmago. Sensacin de que va a vomitar (nuseas). Vmitos o vmitos con sangre. Sensacin de estar demasiado lleno luego de comer. Prdida de peso. Mal aliento. Sangre en las heces (deposiciones). En algunos casos, no hay sntomas. Cmo se trata? Esta afeccin se trata con medicamentos. Los medicamentos que se usan dependen de lo que provoc la afeccin. Es posible que le administren lo siguiente: Antibiticos, si la causa de la afeccin fue una infeccin provocada por grmenes. Bloqueadores H2 y medicamentos similares, si la afeccin fue causada por una gran cantidad de cido en el estmago. El tratamiento tambin puede incluir interrumpir el uso de ciertos medicamentos,  como aspirina o ibuprofeno. Siga estas instrucciones en su casa: Medicamentos Use los medicamentos de venta libre y los recetados solamente como se lo haya indicado el mdico. Si le recetaron un antibitico, tmelo como se lo haya indicado el mdico. No deje de tomarlo aunque comience a sentirse mejor. Consumo de alcohol No beba alcohol si: El mdico le indica que no lo haga. Est embarazada, puede estar embarazada o est tratando de quedar embarazada. Si bebe alcohol: Limite su uso a las siguientes medidas: De 0 a 1 medida por da para las mujeres. De 0 a 2 medidas por da para los hombres. Sepa cunta cantidad de alcohol hay en las bebidas que toma. En los [place], una medida equivale a una botella de cerveza de 12 oz (355 ml), un vaso de vino de 5 oz (148 ml) o un vaso de una bebida alcohlica de alta graduacin de 1 oz (44 ml). Instrucciones generales  Haga comidas pequeas y frecuentes en lugar de comidas abundantes. Evite los alimentos y las bebidas que lo hacen sentir peor. Beba suficiente lquido para mantener el pis (la orina) de color amarillo plido. Converse con el mdico sobre maneras de controlar el estrs. Puede realizar ejercicios de respiracin profunda, meditacin o yoga. No fume ni consuma ningn producto que contenga nicotina o tabaco. Si necesita ayuda para dejar de fumar, consulte al mdico. Concurra a todas las visitas de seguimiento. Comunquese con un mdico si: Sus sntomas empeoran. El dolor de estmago empeora. Los sntomas desaparecen y luego reaparecen. Tiene fiebre. Solicite ayuda de inmediato si: Vomita sangre o una sustancia parecida a los granos de caf. La materia fecal es negra o de color rojo oscuro. Vomita cada vez que intenta tomar lquidos. Estos sntomas pueden indicar una emergencia. Solicite ayuda de inmediato.   Comunquese con el servicio de emergencias de su localidad (911 en los [place]). No espere a ver si los sntomas  desaparecen. No conduzca por sus propios medios hasta el hospital. Resumen La gastritis es irritacin e hinchazn (inflamacin) del estmago. Debe obtener ayuda para tratar esta afeccin. Si no obtiene ayuda, el estmago puede sangrar y puede desarrollar llagas (lceras) en el estmago. Puede recibir tratamiento con medicamentos para los grmenes o medicamentos para bloquear la presencia de demasiada cantidad de cido en el estmago. Esta informacin no tiene como fin reemplazar el consejo del mdico. Asegrese de hacerle al mdico cualquier pregunta que tenga. Document Revised: 10/15/2020 Document Reviewed: 10/15/2020 Elsevier Patient Education  2023 Elsevier Inc.  

## 2022-01-13 NOTE — Progress Notes (Signed)
Has burning in abdomen, states that medication does not work.

## 2022-03-03 ENCOUNTER — Telehealth: Payer: Self-pay | Admitting: Family Medicine

## 2022-03-03 ENCOUNTER — Encounter: Payer: Self-pay | Admitting: Family Medicine

## 2022-03-03 ENCOUNTER — Other Ambulatory Visit: Payer: Self-pay

## 2022-03-03 ENCOUNTER — Ambulatory Visit: Payer: Self-pay | Attending: Family Medicine | Admitting: Family Medicine

## 2022-03-03 VITALS — BP 125/78 | HR 82 | Temp 98.5°F | Ht 59.0 in | Wt 117.0 lb

## 2022-03-03 DIAGNOSIS — F329 Major depressive disorder, single episode, unspecified: Secondary | ICD-10-CM | POA: Insufficient documentation

## 2022-03-03 DIAGNOSIS — F320 Major depressive disorder, single episode, mild: Secondary | ICD-10-CM

## 2022-03-03 DIAGNOSIS — Z13228 Encounter for screening for other metabolic disorders: Secondary | ICD-10-CM

## 2022-03-03 DIAGNOSIS — R1013 Epigastric pain: Secondary | ICD-10-CM

## 2022-03-03 MED ORDER — FLUOXETINE HCL 20 MG PO CAPS
20.0000 mg | ORAL_CAPSULE | Freq: Every day | ORAL | 3 refills | Status: DC
  Filled 2022-03-03: qty 30, 30d supply, fill #0
  Filled 2022-07-07: qty 30, 30d supply, fill #1
  Filled 2022-08-12: qty 30, 30d supply, fill #2

## 2022-03-03 NOTE — Progress Notes (Signed)
Subjective:  Patient ID: Hayley Garcia, female    DOB: 1975-08-28  Age: 46 y.o. MRN: [id number]  CC: Depression   HPI Hayley Garcia is a 46 y.o. year old female with a history of H. pylori gastritis (completed treatment), GERD here for an office visit.  Interval History:  She Complains of being Depressed, feeling like crying, feels sad. States symptoms are absent at the moment. It occurs when she is alone at home. She is a single Mom and lives alone with her 83 yr old daughter with no other family member and she has no friends. In the past she thought it would be better if she were not here but she wants to go on living for her daughter. She has no suicidal intent.  She would like to have a repeat H.pylori breath test. Last test in 11/2020 was negative after initial positive test in 09/2020.  She complains of ongoing epigastric pain despite adherence with omeprazole.  Past Medical History:  Diagnosis Date   Anemia    hx of   Anxiety    not on meds at this time   Back pain    Chronic headaches    Continuous urine leakage    Cough    Excessive thirst    GERD (gastroesophageal reflux disease)    not on meds as of 06/17/2021   Hepatic steatosis    Menstrual pain    Night sweats    Pre-diabetes    Shortness of breath    Sore throat    Umbilical hernia    Vision changes     Past Surgical History:  Procedure Laterality Date   ANKLE SURGERY Left    pin    Family History  Problem Relation Age of Onset   Colon polyps Neg Hx    Colon cancer Neg Hx    Esophageal cancer Neg Hx    Rectal cancer Neg Hx    Stomach cancer Neg Hx     Social History   Socioeconomic History   Marital status: Single    Spouse name: Not on file   Number of children: 2   Years of education: Not on file   Highest education level: Not on file  Occupational History   Occupation: Chartered certified accountant  Tobacco Use   Smoking status: Never   Smokeless tobacco: Never  Vaping Use    Vaping Use: Never used  Substance and Sexual Activity   Alcohol use: Not Currently   Drug use: Never   Sexual activity: Not Currently  Other Topics Concern   Not on file  Social History Narrative   Not on file   Social Determinants of Health   Financial Resource Strain: Not on file  Food Insecurity: Not on file  Transportation Needs: Not on file  Physical Activity: Not on file  Stress: Not on file  Social Connections: Not on file    No Known Allergies  Outpatient Medications Prior to Visit  Medication Sig Dispense Refill   albuterol (VENTOLIN HFA) 108 (90 Base) MCG/ACT inhaler Inhale 2 puffs into the lungs every 6 (six) hours as needed for wheezing or shortness of breath. 18 g 2   cetirizine (ZYRTEC) 10 MG tablet Take 1 tablet (10 mg total) by mouth daily. 30 tablet 1   fluticasone (FLONASE) 50 MCG/ACT nasal spray Place 2 sprays into both nostrils daily. 16 g 1   omeprazole (PRILOSEC) 20 MG capsule Take 1 capsule (20 mg total) by mouth 2 (two) times  daily before a meal. 60 capsule 3   sucralfate (CARAFATE) 1 g tablet Take 1 tablet (1 g total) by mouth 4 (four) times daily -  with meals and at bedtime for 14 days. 56 tablet 0   No facility-administered medications prior to visit.     ROS Review of Systems  Constitutional:  Negative for activity change and appetite change.  HENT:  Negative for sinus pressure and sore throat.   Respiratory:  Negative for chest tightness, shortness of breath and wheezing.   Cardiovascular:  Negative for chest pain and palpitations.  Gastrointestinal:  Positive for abdominal pain. Negative for abdominal distention and constipation.  Genitourinary: Negative.   Musculoskeletal: Negative.   Psychiatric/Behavioral:  Positive for dysphoric mood. Negative for behavioral problems.     Objective:  BP 125/78   Pulse 82   Temp 98.5 F (36.9 C) (Oral)   Ht 4' 11"  (1.499 m)   Wt 117 lb (53.1 kg)   SpO2 99%   BMI 23.63 kg/m      03/03/2022    11:46 AM 01/13/2022    9:32 AM 11/25/2021    9:18 AM  BP/Weight  Systolic BP 080 223 361  Diastolic BP 78 73 82  Wt. (Lbs) 117 118.2 115.6  BMI 23.63 kg/m2 23.87 kg/m2 23.35 kg/m2      Physical Exam Constitutional:      Appearance: She is well-developed.  Cardiovascular:     Rate and Rhythm: Normal rate.     Heart sounds: Normal heart sounds. No murmur heard. Pulmonary:     Effort: Pulmonary effort is normal.     Breath sounds: Normal breath sounds. No wheezing or rales.  Chest:     Chest wall: No tenderness.  Abdominal:     General: Bowel sounds are normal. There is no distension.     Palpations: Abdomen is soft. There is no mass.     Tenderness: There is no abdominal tenderness.  Musculoskeletal:        General: Normal range of motion.     Right lower leg: No edema.     Left lower leg: No edema.  Neurological:     Mental Status: She is alert and oriented to person, place, and time.  Psychiatric:     Comments: Dysphoric mood        Latest Ref Rng & Units 09/09/2021   12:42 AM 02/20/2021    6:08 PM 08/12/2020    2:39 PM  CMP  Glucose 70 - 99 mg/dL 101  92  82   BUN 6 - 20 mg/dL 9  15  9    Creatinine 0.44 - 1.00 mg/dL 0.57  0.50  0.56   Sodium 135 - 145 mmol/L 137  138  138   Potassium 3.5 - 5.1 mmol/L 3.8  4.1  4.3   Chloride 98 - 111 mmol/L 103  104  101   CO2 22 - 32 mmol/L 27  26  21    Calcium 8.9 - 10.3 mg/dL 9.1  8.9  9.4   Total Protein 6.5 - 8.1 g/dL 7.0  6.9  7.6   Total Bilirubin 0.3 - 1.2 mg/dL 0.4  0.3  0.3   Alkaline Phos 38 - 126 U/L 76  70  83   AST 15 - 41 U/L 16  16  16    ALT 0 - 44 U/L 13  15  7      Lipid Panel  No results found for: "CHOL", "TRIG", "HDL", "CHOLHDL", "VLDL", "LDLCALC", "LDLDIRECT"  CBC    Component Value Date/Time   WBC 12.2 (H) 09/09/2021 0042   RBC 4.73 09/09/2021 0042   HGB 13.3 09/09/2021 0042   HGB 15.1 08/12/2020 1439   HCT 41.6 09/09/2021 0042   HCT 46.5 08/12/2020 1439   PLT 333 09/09/2021 0042   PLT 338  08/12/2020 1439   MCV 87.9 09/09/2021 0042   MCV 87 08/12/2020 1439   MCH 28.1 09/09/2021 0042   MCHC 32.0 09/09/2021 0042   RDW 14.0 09/09/2021 0042   RDW 13.2 08/12/2020 1439   LYMPHSABS 2.8 09/09/2021 0042   LYMPHSABS 2.1 08/12/2020 1439   MONOABS 0.9 09/09/2021 0042   EOSABS 0.4 09/09/2021 0042   EOSABS 0.4 08/12/2020 1439   BASOSABS 0.1 09/09/2021 0042   BASOSABS 0.1 08/12/2020 1439    Lab Results  Component Value Date   HGBA1C 5.9 (H) 08/12/2020    Assessment & Plan:  1. Current mild episode of major depressive disorder without prior episode ([place]) Uncontrolled Current situation as a single mom with lack of family support contributing to her symptoms We will refer to LCSW for counseling SSRI initiated - FLUoxetine (PROZAC) 20 MG capsule; Take 1 capsule (20 mg total) by mouth daily.  Dispense: 30 capsule; Refill: 3  2. Screening for metabolic disorder - LP+Non-HDL Cholesterol; Future - CMP14+EGFR; Future - CBC with Differential/Platelet; Future  3. Abdominal pain, epigastric Completed treatment for H. pylori gastritis with subsequent negative H. pylori test in 11/2020 Previously followed by GI Advised to hold off on omeprazole for 2 weeks and will perform H. pylori breath test - H. pylori breath test; Future    Meds ordered this encounter  Medications   FLUoxetine (PROZAC) 20 MG capsule    Sig: Take 1 capsule (20 mg total) by mouth daily.    Dispense:  30 capsule    Refill:  3    Follow-up: Return in about 3 months (around 06/03/2022) for Depression.       Charlott Rakes, MD, FAAFP. [facility] [place], [facility]   03/03/2022, 12:44 PM

## 2022-03-03 NOTE — Telephone Encounter (Signed)
Patient with depression, started on Prozac today.  Please evaluate for counseling.  Thank you.

## 2022-03-03 NOTE — Patient Instructions (Signed)
Prueba de deteccin de la depresin Depression Screening La prueba de deteccin de la depresin es una herramienta que el mdico puede usar para saber si usted tiene sntomas de depresin. La depresin es una afeccin frecuente, con muchos sntomas que tambin estn presentes a menudo en otros trastornos. La depresin puede tratarse, pero primero debe diagnosticarse. Quizs usted no sepa que determinados sentimientos, pensamientos y [employer] est teniendo pueden ser sntomas de depresin. La prueba de deteccin de la depresin puede ayudar a que usted y su mdico decidan si necesita una evaluacin adicional o si deben derivarlo a un profesional de salud mental. Qu son las pruebas de deteccin? Pueden hacerle un examen fsico para ver si hay otro trastorno que le afecte la salud mental. Durante el examen fsico, pueden extraerle Truddie [place] de [place] o pedirle [place]. Pueden hacerle una entrevista u ofrecerle una prueba escrita con una herramienta de deteccin creada a partir de investigaciones, por ejemplo, una de las siguientes: Cuestionario de Salud del Paciente (Patient Health Questionnaire, PHQ). Este es un grupo de 2 o 9 preguntas. Un profesional capacitado para calificar esta prueba de deteccin [place] una gua a fin de evaluar si sus sntomas sugieren que usted tiene depresin. [place] de Calificacin de la Depresin de [facility] Depression Rating Scale, HAM-D). Este es un grupo de 17 o 24 preguntas. Es posible que le soliciten que las responda de nuevo durante o despus del tratamiento para ver si la depresin ha mejorado. Inventario de Depresin de Beck (Beck Depression Inventory, BDI). Este es un grupo de 21 preguntas de respuesta mltiple. El mdico califica las respuestas para evaluar lo siguiente: Su nivel de depresin, que puede ser de leve a grave. Su respuesta al tratamiento. El mdico puede hablar con usted acerca de sus actividades diarias, por ejemplo, comer,  dormir, trabajar y Tree surgeon, y preguntarle si ha habido algn [employer]. El mdico puede solicitarle que consulte a Teaching laboratory technician en salud mental, como un psiquiatra o un psiclogo, para que realice otra evaluacin. Quin debe realizarse la prueba de deteccin de la depresin?  [employer], incluidos los adultos con antecedentes familiares de un trastorno de salud mental. Las personas que tienen entre 10 y 21 aos. Las [employer] se estn recuperando de una afeccin aguda, como un infarto de miocardio (IM) o un accidente cerebrovascular. Las mujeres [place] a Actuary. Las personas que tienen una enfermedad crnica. Cualquier persona a Engineer, maintenance diagnosticado otro tipo de trastorno de salud mental. Cualquier persona con sntomas que podran mostrar depresin. [place] significan los resultados? El mdico revisar los resultados de la prueba de deteccin de la depresin, del examen fsico y de los anlisis de laboratorio. Las pruebas de deteccin con resultado positivo sugieren la presencia de depresin. La prueba de deteccin es Software engineer paso para recibir la atencin que Research scientist (physical sciences). Ser importante que [employer] de sus [place]. Consulte a su mdico o a alguien del departamento donde se realice las pruebas de deteccin cundo [employer]. Hable con el mdico [address] [place], el diagnstico y [place]. El diagnstico de depresin se establece con informacin del Manual diagnstico y estadstico de los trastornos mentales (Diagnostic and Statistical Manual of Mental Disorders, DSM-V). En este libro, se incluyen la cantidad y el tipo de sntomas que deben estar presentes para que un profesional brinde un diagnstico especfico. Su mdico puede trabajar con usted para  tratar los sntomas de su depresin o puede ayudarle a Pension scheme manager un profesional de salud mental que pueda  evaluar y ayudar a Actor un plan para tratar su depresin. Solicite ayuda de inmediato si: Tiene pensamientos acerca de lastimarse a usted mismo o a Producer, television/film/video. Si alguna vez siente que puede lastimarse o Market researcher a Producer, television/film/video, o tiene pensamientos de poner fin a su vida, busque ayuda de inmediato. Dirjase al servicio de urgencias ms cercano o: Comunquese con el servicio de emergencias de su localidad (911 en los [place]). Llame a una lnea de asistencia al suicida y atencin en crisis como National Suicide Prevention Lifeline ([place]) al (416) [phone]. Est disponible las 24 horas del da en los [place] Enve un mensaje de texto a la lnea para casos de crisis al [phone] (en los [place] Resumen La prueba de deteccin de la depresin es Software engineer paso para recibir la ayuda que Research scientist (physical sciences). Si la prueba de deteccin muestra sntomas de depresin (es positiva), el mdico puede solicitarle que consulte a un profesional de salud mental que le ayude a identificar formas de tratar su depresin. Cualquier persona con una edad mnima de 10 aos debera realizarse una prueba de deteccin de la depresin. Esta informacin no tiene Marine scientist el consejo del mdico. Asegrese de hacerle al mdico cualquier pregunta que tenga. Document Revised: 09/17/2020 Document Reviewed: 09/17/2020 Elsevier Patient Education  [place].

## 2022-03-08 ENCOUNTER — Ambulatory Visit: Payer: Self-pay | Admitting: *Deleted

## 2022-03-08 NOTE — Telephone Encounter (Signed)
She does not need an office visit as I recently saw her.  She just needs to undergo her blood test which I had ordered including a CBC, H. pylori and I will get back to her with results. She needs to contact her GI as she is currently under the care of GI and her abdominal symptoms have been uncontrolled.

## 2022-03-08 NOTE — Telephone Encounter (Signed)
Via interpreter Katharine Look [phone] Chief Complaint: black stools 4 days Symptoms: abd pain Frequency: 4 days multiple stools a day Pertinent Negatives: Patient denies being dizzy Disposition: [x] ED /[] Urgent Care (no appt availability in office) / [] Appointment(In office/virtual)/ []  [facility]/ [] Home Care/ [] Refused Recommended Disposition /[] [place] [facility]/ []  Follow-up with PCP Additional Notes: Pt very difficult to get to understand even with interpreter. Pt is working with someone who speaks [place]. Phone keeps getting cut off. Interpreter left message the last time to ask driver of her work group to drop her off at an emergency room on the way back to the office.   Reason for Disposition  Blood in bowel movements  (Exception: Blood on surface of BM with constipation.)  Answer Assessment - Initial Assessment Questions 1. LOCATION: "Where does it hurt?"      Abd hurts, bloated 2. RADIATION: "Does the pain shoot anywhere else?" (e.g., chest, back)     Upper abd, under ribs and burns in front 3. ONSET: "When did the pain begin?" (e.g., minutes, hours or days ago)      3 days of black stools, first solid, now watery 4. SUDDEN: "Gradual or sudden onset?"     Has gastritis but not like this 5. PATTERN "Does the pain come and go, or is it constant?"    - If it comes and goes: "How long does it last?" "Do you have pain now?"     (Note: Comes and goes means the pain is intermittent. It goes away completely between bouts.)    - If constant: "Is it getting better, staying the same, or getting worse?"      (Note: Constant means the pain never goes away completely; most serious pain is constant and gets worse.)      It is just slightly better today 6. SEVERITY: "How bad is the pain?"  (e.g., Scale 1-10; mild, moderate, or severe)    - MILD (1-3): Doesn't interfere with normal activities, abdomen soft and not tender to touch.     - MODERATE (4-7): Interferes with normal  activities or awakens from sleep, abdomen tender to touch.     - SEVERE (8-10): Excruciating pain, doubled over, unable to do any normal activities.       3 today 7. RECURRENT SYMPTOM: "Have you ever had this type of stomach pain before?" If Yes, ask: "When was the last time?" and "What happened that time?"      Has gastritis but not black stools before, burping a lot 8. CAUSE: "What do you think is causing the stomach pain?"     gastritis 9. RELIEVING/AGGRAVATING FACTORS: "What makes it better or worse?" (e.g., antacids, bending or twisting motion, bowel movement)     Takes ntacids 10. OTHER SYMPTOMS: "Do you have any other symptoms?" (e.g., back pain, diarrhea, fever, urination pain, vomiting)       diarrhea 11. PREGNANCY: "Is there any chance you are pregnant?" "When was your last menstrual period?"       no  Protocols used: Abdominal Pain - [facility]

## 2022-03-08 NOTE — Telephone Encounter (Signed)
Pt having black stools, does she need to come in for a visit or can it be over the phone.

## 2022-03-10 ENCOUNTER — Ambulatory Visit: Payer: Self-pay | Admitting: *Deleted

## 2022-03-10 ENCOUNTER — Encounter: Payer: Self-pay | Admitting: Gastroenterology

## 2022-03-10 NOTE — Telephone Encounter (Signed)
Pt has been informed of PCP response  

## 2022-03-10 NOTE — Telephone Encounter (Signed)
No one on line when agent transferred the call to me.

## 2022-03-10 NOTE — Telephone Encounter (Signed)
[place] ID [phone] Patient called requesting information from PCP. Call unable to be transferred from agent due to systems issues. Called patient via interpreter and patient would like to know what she is to do regarding "black stools'. Reviewed message from PCP from 03/08/22 at 12:51 pm : She does not need and office visit as I recently saw her. She just needs to undergo her blood test which I had ordered including a CBC, H. Pylori and I will get back to her wit results. She needs to contact her GI as she is currently under the care of GI and her abdominal sx have been uncontrolled.  Patient requesting to change lab appt to 03/17/22 due to transportation issues and that is only time she could get a ride. Please advise if lab appt needs to be changed. Patient verbalized understanding to contact GI for further issues.    Reason for Disposition  [1] Caller requesting NON-URGENT health information AND [2] PCP's office is the best resource  Answer Assessment - Initial Assessment Questions 1. REASON FOR CALL or QUESTION: "What is your reason for calling today?" or "How can I best help you?" or "What question do you have that I can help answer?"     Patient requesting to know what PCP can do for her sx of black stools.  Protocols used: Information Only Call - No Triage-A-AH

## 2022-03-12 ENCOUNTER — Emergency Department (HOSPITAL_COMMUNITY): Payer: Self-pay

## 2022-03-12 ENCOUNTER — Emergency Department (HOSPITAL_COMMUNITY)
Admission: EM | Admit: 2022-03-12 | Discharge: 2022-03-12 | Disposition: A | Discharge status: Home / Self Care | Payer: Self-pay | Attending: Emergency Medicine | Admitting: Emergency Medicine

## 2022-03-12 ENCOUNTER — Other Ambulatory Visit: Payer: Self-pay

## 2022-03-12 DIAGNOSIS — R1013 Epigastric pain: Secondary | ICD-10-CM | POA: Insufficient documentation

## 2022-03-12 DIAGNOSIS — R110 Nausea: Secondary | ICD-10-CM | POA: Insufficient documentation

## 2022-03-12 LAB — URINALYSIS, ROUTINE W REFLEX MICROSCOPIC
Bacteria, UA: NONE SEEN
Bilirubin Urine: NEGATIVE
Glucose, UA: NEGATIVE mg/dL
Ketones, ur: NEGATIVE mg/dL
Leukocytes,Ua: NEGATIVE
Nitrite: NEGATIVE
Protein, ur: NEGATIVE mg/dL
Specific Gravity, Urine: 1.005 (ref 1.005–1.030)
pH: 6 (ref 5.0–8.0)

## 2022-03-12 LAB — COMPREHENSIVE METABOLIC PANEL
ALT: 13 U/L (ref 0–44)
AST: 18 U/L (ref 15–41)
Albumin: 4 g/dL (ref 3.5–5.0)
Alkaline Phosphatase: 69 U/L (ref 38–126)
Anion gap: 10 (ref 5–15)
BUN: 10 mg/dL (ref 6–20)
CO2: 22 mmol/L (ref 22–32)
Calcium: 8.8 mg/dL — ABNORMAL LOW (ref 8.9–10.3)
Chloride: 106 mmol/L (ref 98–111)
Creatinine, Ser: 0.48 mg/dL (ref 0.44–1.00)
GFR, Estimated: >60 mL/min (ref >60)
Glucose, Bld: 104 mg/dL — ABNORMAL HIGH (ref 70–99)
Potassium: 4 mmol/L (ref 3.5–5.1)
Sodium: 138 mmol/L (ref 135–145)
Total Bilirubin: 0.3 mg/dL (ref 0.3–1.2)
Total Protein: 7.3 g/dL (ref 6.5–8.1)

## 2022-03-12 LAB — CBC
HCT: 45.1 % (ref 36.0–46.0)
Hemoglobin: 14 g/dL (ref 12.0–15.0)
MCH: 27.8 pg (ref 26.0–34.0)
MCHC: 31 g/dL (ref 30.0–36.0)
MCV: 89.5 fL (ref 80.0–100.0)
Platelets: 356 10*3/uL (ref 150–400)
RBC: 5.04 MIL/uL (ref 3.87–5.11)
RDW: 14.9 % (ref 11.5–15.5)
WBC: 10.1 10*3/uL (ref 4.0–10.5)
nRBC: 0 % (ref 0.0–0.2)

## 2022-03-12 LAB — TROPONIN I (HIGH SENSITIVITY)
Troponin I (High Sensitivity): 4 ng/L (ref <18)
Troponin I (High Sensitivity): 4 ng/L (ref <18)

## 2022-03-12 LAB — I-STAT BETA HCG BLOOD, ED (MC, WL, AP ONLY): I-stat hCG, quantitative: <5 m[IU]/mL (ref <5)

## 2022-03-12 LAB — LIPASE, BLOOD: Lipase: 30 U/L (ref 11–51)

## 2022-03-12 MED ORDER — PANTOPRAZOLE SODIUM 40 MG IV SOLR
40.0000 mg | INTRAVENOUS | Status: AC
  Administered 2022-03-12: 40 mg via INTRAVENOUS
  Filled 2022-03-12: qty 10

## 2022-03-12 MED ORDER — PANTOPRAZOLE SODIUM 40 MG PO TBEC
40.0000 mg | DELAYED_RELEASE_TABLET | Freq: Every day | ORAL | 1 refills | Status: DC
  Filled 2022-03-12: qty 30, 30d supply, fill #0

## 2022-03-12 MED ORDER — ALUM & MAG HYDROXIDE-SIMETH 200-200-20 MG/5ML PO SUSP
30.0000 mL | Freq: Once | ORAL | Status: AC
  Administered 2022-03-12: 30 mL via ORAL
  Filled 2022-03-12: qty 30

## 2022-03-12 NOTE — Discharge Instructions (Addendum)
Your testing was normal, your ultrasound was normal, your blood work was reassuring.  Please stop taking omeprazole and start taking pantoprazole once a day.  I also want you to take Pepcid twice a day.  Please follow-up with your doctor this week, you may return to the ER as needed for severe or worsening symptoms or any other concerns

## 2022-03-12 NOTE — ED Triage Notes (Signed)
Using interpreter, patient complains of lower abdominal pain that feels like it burns that radiates to the L side of her back along with pain underneath her chest- describes it as burning and sharp. This has been ongoing for 3 days. Patient reports dark stools, diarrhea. Patient feeling nauseated. Patient denies shortness of breath. Denies any urinary symptoms. Aox4. Patient with hx of gastritis.

## 2022-03-12 NOTE — ED Notes (Signed)
Patient transported to Ultrasound 

## 2022-03-12 NOTE — ED Provider Notes (Signed)
[facility] EMERGENCY DEPARTMENT Provider Note   CSN: [id number] Arrival date & time: 03/12/22  1303     History  Chief Complaint  Patient presents with   Abdominal Pain    Hayley Garcia is a 46 y.o. female.   Abdominal Pain  Patient is a 46 year old female, she is Spanish only speaking, she has no chronic abdominal disc problems in the past however she has been diagnosed with some acid reflux and has been taking omeprazole.  She reports that over the last week she has been dealing with some upper abdominal discomfort located in the epigastrium left and right upper quadrants which does not get worse with eating, it does seem to get worse with laying down and has been gradually worsening over the week.  It is not seeming to get better with omeprazole.  It is associated with some nausea but no vomiting.  She has had several days of watery black looking stools.  The patient denies any prior abdominal surgery including appendicitis, cholecystitis or any other abdominal surgery and denies being pregnant.  She has no urinary symptoms including dysuria, hematuria or frequency.    Home Medications Prior to Admission medications   Medication Sig Start Date End Date Taking? Authorizing Provider  pantoprazole (PROTONIX) 40 MG tablet Take 1 tablet (40 mg total) by mouth daily. 03/12/22 05/11/22 Yes Noemi Chapel, MD  albuterol (VENTOLIN HFA) 108 (90 Base) MCG/ACT inhaler Inhale 2 puffs into the lungs every 6 (six) hours as needed for wheezing or shortness of breath. 01/13/22   Charlott Rakes, MD  cetirizine (ZYRTEC) 10 MG tablet Take 1 tablet (10 mg total) by mouth daily. 01/13/22   Charlott Rakes, MD  FLUoxetine (PROZAC) 20 MG capsule Take 1 capsule (20 mg total) by mouth daily. 03/03/22   Charlott Rakes, MD  fluticasone (FLONASE) 50 MCG/ACT nasal spray Place 2 sprays into both nostrils daily. 01/13/22   Charlott Rakes, MD  sucralfate (CARAFATE) 1 g tablet Take 1  tablet (1 g total) by mouth 4 (four) times daily -  with meals and at bedtime for 14 days. 09/09/21 09/30/21  Smoot, Leary Roca, PA-C      Allergies    Patient has no known allergies.    Review of Systems   Review of Systems  Gastrointestinal:  Positive for abdominal pain.  All other systems reviewed and are negative.   Physical Exam Updated Vital Signs BP (!) 130/91   Pulse 86   Temp 97.8 F (36.6 C)   Resp 12   SpO2 100%  Physical Exam Vitals and nursing note reviewed.  Constitutional:      General: She is not in acute distress.    Appearance: She is well-developed.  HENT:     Head: Normocephalic and atraumatic.     Mouth/Throat:     Pharynx: No oropharyngeal exudate.  Eyes:     General: No scleral icterus.       Right eye: No discharge.        Left eye: No discharge.     Conjunctiva/sclera: Conjunctivae normal.     Pupils: Pupils are equal, round, and reactive to light.  Neck:     Thyroid: No thyromegaly.     Vascular: No JVD.  Cardiovascular:     Rate and Rhythm: Normal rate and regular rhythm.     Heart sounds: Normal heart sounds. No murmur heard.    No friction rub. No gallop.  Pulmonary:     Effort: Pulmonary effort  is normal. No respiratory distress.     Breath sounds: Normal breath sounds. No wheezing or rales.  Abdominal:     General: Bowel sounds are normal. There is no distension.     Palpations: Abdomen is soft. There is no mass.     Tenderness: There is abdominal tenderness.     Comments: Extremely soft abdomen, there is mild tenderness in the right upper epigastric and left upper quadrants.  There is no back pain or CVA tenderness, no guarding or peritoneal signs, no pulsating mass and absolutely no lower abdominal tenderness  Musculoskeletal:        General: No tenderness. Normal range of motion.     Cervical back: Normal range of motion and neck supple.     Right lower leg: No edema.     Left lower leg: No edema.  Lymphadenopathy:     Cervical: No  cervical adenopathy.  Skin:    General: Skin is warm and dry.     Findings: No erythema or rash.  Neurological:     Mental Status: She is alert.     Coordination: Coordination normal.  Psychiatric:        Behavior: Behavior normal.     ED Results / Procedures / Treatments   Labs (all labs ordered are listed, but only abnormal results are displayed) Labs Reviewed  COMPREHENSIVE METABOLIC PANEL - Abnormal; Notable for the following components:      Result Value   Glucose, Bld 104 (*)    Calcium 8.8 (*)    All other components within normal limits  URINALYSIS, ROUTINE W REFLEX MICROSCOPIC - Abnormal; Notable for the following components:   Color, Urine STRAW (*)    Hgb urine dipstick SMALL (*)    All other components within normal limits  LIPASE, BLOOD  CBC  I-STAT BETA HCG BLOOD, ED ([facility], [facility], [facility] ONLY)  TROPONIN I (HIGH SENSITIVITY)  TROPONIN I (HIGH SENSITIVITY)    EKG EKG Interpretation  Date/Time:  Saturday March 12 2022 14:03:07 EDT Ventricular Rate:  78 PR Interval:  120 QRS Duration: 94 QT Interval:  382 QTC Calculation: 435 R Axis:   56 Text Interpretation: Normal sinus rhythm Incomplete right bundle branch block Borderline ECG No previous ECGs available Confirmed by Eber Hong ([id number]) on 03/12/2022 6:10:57 PM  Radiology US Abdomen Limited RUQ (LIVER/GB)  Result Date: 03/12/2022 CLINICAL DATA:  Right upper quadrant pain EXAM: ULTRASOUND ABDOMEN LIMITED RIGHT UPPER QUADRANT COMPARISON:  09/09/2021 FINDINGS: Gallbladder: No gallstones or wall thickening visualized. No sonographic Murphy sign noted by sonographer. Common bile duct: Diameter: 1.7 mm Liver: Heterogeneity and slight increased echogenicity is noted consistent with fatty infiltration. No focal mass is noted. Portal vein is patent on color Doppler imaging with normal direction of blood flow towards the liver. Other: None. IMPRESSION: Stable fatty liver. No acute abnormality noted. Electronically Signed    By: Alcide Clever M.D.   On: 03/12/2022 19:25   DG Chest 2 View  Result Date: 03/12/2022 CLINICAL DATA:  Chest pain EXAM: CHEST - 2 VIEW COMPARISON:  None Available. FINDINGS: The heart size and mediastinal contours are within normal limits. Both lungs are clear. The visualized skeletal structures are unremarkable. IMPRESSION: No active cardiopulmonary disease. Electronically Signed   By: Duanne Guess D.O.   On: 03/12/2022 14:32    Procedures Procedures    Medications Ordered in ED Medications  pantoprazole (PROTONIX) injection 40 mg (40 mg Intravenous Given 03/12/22 1848)  alum & mag hydroxide-simeth (MAALOX/MYLANTA) 200-200-20 MG/5ML  suspension 30 mL (30 mLs Oral Given 03/12/22 1848)    ED Course/ Medical Decision Making/ A&P                           Medical Decision Making Amount and/or Complexity of Data Reviewed Labs: ordered. Radiology: ordered.  Risk OTC drugs. Prescription drug management.   This patient presents to the ED for concern of abdominal discomfort, this involves an extensive number of treatment options, and is a complaint that carries with it a high risk of complications and morbidity.  The differential diagnosis includes gastritis, cholecystitis, peptic ulcer disease, pancreatitis, esophagitis, seems less likely to be bowel obstruction or intra-abdominal mass   Co morbidities that complicate the patient evaluation  Recurrent visits to the hospital and family doctor for gastritis and dyspepsia   Additional history obtained:  Additional history obtained from electronic medical record External records from outside source obtained and reviewed including multiple office visits including seeing gastroenterology with [place], colon cancer screening Upper endoscopy performed February 2023, Dr. Tomasa Rand performed the study, normal-appearing esophagus, normal-appearing stomach, unremarkable exam Ultrasound of the abdomen performed in April and CT scan of  the abdomen performed last September showed fatty liver without focal lesions and a small left ovarian cyst respectively.   Lab Tests:  I Ordered, and personally interpreted labs.  The pertinent results include: CBC metabolic panel and urinalysis as well as lipase and troponin all unremarkable   Imaging Studies ordered:  I ordered imaging studies including right upper quadrant ultrasound I independently visualized and interpreted imaging which showed no acute findings to suggest a cause for the pain I agree with the radiologist interpretation   Cardiac Monitoring: / EKG:  The patient was maintained on a cardiac monitor.  I personally viewed and interpreted the cardiac monitored which showed an underlying rhythm of: Normal sinus rhythm   Problem List / ED Course / Critical interventions / Medication management  Reviewed prior notes, has had successful endoscopies and colonoscopies without acute finding, labs been totally unremarkable Patient given pantoprazole and GI cocktail, well-appearing I ordered medication including GI cocktail and PPI for gastric discomfort Reevaluation of the patient after these medicines showed that the patient of I have reviewed the patients home medicines and have made adjustments as needed   Social Determinants of Health:  Language barrier, translator and interpreter used   Test / Admission - Considered:  Considered admission but no findings to suggest the need for inpatient or surgical care   I have discussed with the patient at the bedside the results, and the meaning of these results.  They have expressed her understanding to the need for follow-up with primary care physician        Final Clinical Impression(s) / ED Diagnoses Final diagnoses:  Epigastric pain    Rx / DC Orders ED Discharge Orders          Ordered    pantoprazole (PROTONIX) 40 MG tablet  Daily        03/12/22 1950              Eber Hong, MD 03/12/22  1952

## 2022-03-12 NOTE — ED Notes (Signed)
D/c instructions reviewed using ipad spanish interpreter. Pt verbalized understanding of d/c instructions, prescription and follow up care.

## 2022-03-12 NOTE — ED Notes (Signed)
Pt A&OX4 ambulatory at d/c with independent steady gait 

## 2022-03-14 ENCOUNTER — Other Ambulatory Visit: Payer: Self-pay

## 2022-03-17 ENCOUNTER — Other Ambulatory Visit: Payer: Self-pay

## 2022-03-17 ENCOUNTER — Ambulatory Visit: Payer: Self-pay | Attending: Family Medicine

## 2022-03-17 DIAGNOSIS — R1013 Epigastric pain: Secondary | ICD-10-CM

## 2022-03-17 DIAGNOSIS — Z13228 Encounter for screening for other metabolic disorders: Secondary | ICD-10-CM

## 2022-03-18 LAB — CBC WITH DIFFERENTIAL/PLATELET
Basophils Absolute: 0.1 10*3/uL (ref 0.0–0.2)
Basos: 1 %
EOS (ABSOLUTE): 0.4 10*3/uL (ref 0.0–0.4)
Eos: 4 %
Hematocrit: 43.6 % (ref 34.0–46.6)
Hemoglobin: 13.9 g/dL (ref 11.1–15.9)
Immature Grans (Abs): 0 10*3/uL (ref 0.0–0.1)
Immature Granulocytes: 0 %
Lymphocytes Absolute: 2.1 10*3/uL (ref 0.7–3.1)
Lymphs: 22 %
MCH: 27.6 pg (ref 26.6–33.0)
MCHC: 31.9 g/dL (ref 31.5–35.7)
MCV: 87 fL (ref 79–97)
Monocytes Absolute: 0.6 10*3/uL (ref 0.1–0.9)
Monocytes: 6 %
Neutrophils Absolute: 6.4 10*3/uL (ref 1.4–7.0)
Neutrophils: 67 %
Platelets: 324 10*3/uL (ref 150–450)
RBC: 5.03 x10E6/uL (ref 3.77–5.28)
RDW: 13.9 % (ref 11.7–15.4)
WBC: 9.7 10*3/uL (ref 3.4–10.8)

## 2022-03-18 LAB — CMP14+EGFR
ALT: 15 IU/L (ref 0–32)
AST: 19 IU/L (ref 0–40)
Albumin/Globulin Ratio: 1.5 (ref 1.2–2.2)
Albumin: 4.3 g/dL (ref 3.9–4.9)
Alkaline Phosphatase: 87 IU/L (ref 44–121)
BUN/Creatinine Ratio: 19 (ref 9–23)
BUN: 12 mg/dL (ref 6–24)
Bilirubin Total: 0.3 mg/dL (ref 0.0–1.2)
CO2: 20 mmol/L (ref 20–29)
Calcium: 8.7 mg/dL (ref 8.7–10.2)
Chloride: 102 mmol/L (ref 96–106)
Creatinine, Ser: 0.62 mg/dL (ref 0.57–1.00)
Globulin, Total: 2.9 g/dL (ref 1.5–4.5)
Glucose: 85 mg/dL (ref 70–99)
Potassium: 4.8 mmol/L (ref 3.5–5.2)
Sodium: 139 mmol/L (ref 134–144)
Total Protein: 7.2 g/dL (ref 6.0–8.5)
eGFR: 111 mL/min/{1.73_m2} (ref >59)

## 2022-03-18 LAB — LP+NON-HDL CHOLESTEROL
Cholesterol, Total: 180 mg/dL (ref 100–199)
HDL: 41 mg/dL (ref >39)
LDL Chol Calc (NIH): 112 mg/dL — ABNORMAL HIGH (ref 0–99)
Total Non-HDL-Chol (LDL+VLDL): 139 mg/dL — ABNORMAL HIGH (ref 0–129)
Triglycerides: 151 mg/dL — ABNORMAL HIGH (ref 0–149)
VLDL Cholesterol Cal: 27 mg/dL (ref 5–40)

## 2022-03-18 LAB — H. PYLORI BREATH TEST: H pylori Breath Test: NEGATIVE

## 2022-03-23 NOTE — Telephone Encounter (Signed)
LCSWA called using (interpreter [phone]) patient today to introduce herself and to assess patients' mental health needs. Patient did not answer the phone. LCSWA was able to leave a brief message with the patient asking them to return the call. Patient was referred by PCP for depression.

## 2022-03-24 ENCOUNTER — Other Ambulatory Visit: Payer: Self-pay

## 2022-03-24 ENCOUNTER — Ambulatory Visit: Payer: Self-pay | Admitting: *Deleted

## 2022-03-24 NOTE — Telephone Encounter (Signed)
Interpreter: [phone] Patient gave conflicting answers to questions- appointment scheduled.  Chief Complaint: patient called for lab results and requested stool check-dark stools  Symptoms: dark stool, bloating with eating Frequency: 2 weeks Pertinent Negatives: Patient denies pain, diarrhea, fever Disposition: [] ED /[] Urgent Care (no appt availability in office) / [x] Appointment(In office/virtual)/ []  [place] [place] [facility]/ [] Home Care/ [] Refused Recommended Disposition /[]  Mobile Bus/ []  Follow-up with PCP Additional Notes: Patient is requesting a stool check- she has been taking Pepto Bismol- but reports she has stopped it for more then 24 hours and still has dark stool. Appointment scheduled per patient request.

## 2022-03-24 NOTE — Telephone Encounter (Signed)
Note from RN reviewed. I have also reviewed Dr. Smitty Pluck office visit note when patient was seen earlier this month and ER visit note.  Last 2 CBCs stable. Agree with appt as scheduled.

## 2022-03-24 NOTE — Telephone Encounter (Signed)
Reason for Disposition  Unusual stool color probably from food or medicine  Answer Assessment - Initial Assessment Questions 1. COLOR: "What color is it?" "Is that color in part or all of the stool?"     Dark stool 2. ONSET: "When was the unusual color first noted?"     2 weeks 3. CAUSE: "Have you eaten any food or taken any medicine of this color?" Note: See listing in Background Information section.      Pepto bismol 4. OTHER SYMPTOMS: "Do you have any other symptoms?" (e.g., abdomen pain, diarrhea, jaundice, fever).     bloating  Protocols used: Stools - Unusual Color-A-AH

## 2022-04-13 NOTE — Progress Notes (Deleted)
Patient ID: Hayley Garcia, female   DOB: 03/01/1976, 46 y.o.   MRN: [id number]   After ED visit 03/12/2022  From ED note: Medical Decision Making Amount and/or Complexity of Data Reviewed Labs: ordered. Radiology: ordered.   Risk OTC drugs. Prescription drug management.     This patient presents to the ED for concern of abdominal discomfort, this involves an extensive number of treatment options, and is a complaint that carries with it a high risk of complications and morbidity.  The differential diagnosis includes gastritis, cholecystitis, peptic ulcer disease, pancreatitis, esophagitis, seems less likely to be bowel obstruction or intra-abdominal mass     Co morbidities that complicate the patient evaluation   Recurrent visits to the hospital and family doctor for gastritis and dyspepsia     Additional history obtained:   Additional history obtained from electronic medical record External records from outside source obtained and reviewed including multiple office visits including seeing gastroenterology with , colon cancer screening Upper endoscopy performed February 2023, Dr. Tomasa Rand performed the study, normal-appearing esophagus, normal-appearing stomach, unremarkable exam Ultrasound of the abdomen performed in April and CT scan of the abdomen performed last September showed fatty liver without focal lesions and a small left ovarian cyst respectively.     Lab Tests:   I Ordered, and personally interpreted labs.  The pertinent results include: CBC metabolic panel and urinalysis as well as lipase and troponin all unremarkable     Imaging Studies ordered:   I ordered imaging studies including right upper quadrant ultrasound I independently visualized and interpreted imaging which showed no acute findings to suggest a cause for the pain I agree with the radiologist interpretation     Cardiac Monitoring: / EKG:   The patient was maintained on a cardiac  monitor.  I personally viewed and interpreted the cardiac monitored which showed an underlying rhythm of: Normal sinus rhythm     Problem List / ED Course / Critical interventions / Medication management   Reviewed prior notes, has had successful endoscopies and colonoscopies without acute finding, labs been totally unremarkable Patient given pantoprazole and GI cocktail, well-appearing I ordered medication including GI cocktail and PPI for gastric discomfort Reevaluation of the patient after these medicines showed that the patient of I have reviewed the patients home medicines and have made adjustments as needed     Social Determinants of Health:   Language barrier, translator and interpreter used     Test / Admission - Considered:   Considered admission but no findings to suggest the need for inpatient or surgical care

## 2022-04-14 ENCOUNTER — Ambulatory Visit: Payer: Self-pay | Admitting: Physician Assistant

## 2022-04-29 ENCOUNTER — Telehealth: Payer: Self-pay

## 2022-04-29 NOTE — Telephone Encounter (Signed)
Patient has called into the office twice regarding her upcoming appt. Pt is self pay. She has been advised that she can keep her appt as scheduled and we will have [facility] financial assistance forms for her to complete when she arrives. Pt called back in, she is not able to keep her appt on 05/05/22 because she does not have a ride. Pt wanted to specifically see Dr. Tomasa Rand, we scheduled her for the next available appt on 06/09/22 at 10:10 am. Pt requested that I mail her the financial assistance application, she confirmed address on file. Pt had no other concerns at the end of the call.

## 2022-05-04 ENCOUNTER — Telehealth: Payer: Self-pay | Admitting: Gastroenterology

## 2022-05-04 NOTE — Telephone Encounter (Addendum)
Patient called said she has been bleeding from her mouth every time she brushes her teeth, explained she has been treated for upper GI issues and so she is wondering if that has anything to do with it.

## 2022-05-04 NOTE — Telephone Encounter (Signed)
Left message on machine to call back  

## 2022-05-05 ENCOUNTER — Ambulatory Visit: Payer: Self-pay | Admitting: Gastroenterology

## 2022-05-05 NOTE — Telephone Encounter (Signed)
I have called the pt on several occasions with no response. Will await further communication from the pt.

## 2022-05-05 NOTE — Telephone Encounter (Signed)
Left message on machine to call back  

## 2022-05-16 ENCOUNTER — Telehealth: Payer: Self-pay | Admitting: Emergency Medicine

## 2022-05-16 NOTE — Telephone Encounter (Signed)
Copied from CRM [id number]. Topic: General - Inquiry >> May 13, 2022  2:47 PM Hayley Garcia wrote: Reason for CRM: pt wants to know if she can come in Wed for the lab to collect her stool.   I do not see an order.  Please advise

## 2022-05-19 ENCOUNTER — Ambulatory Visit: Payer: Self-pay | Admitting: Physician Assistant

## 2022-05-27 ENCOUNTER — Ambulatory Visit: Payer: Self-pay

## 2022-05-27 NOTE — Telephone Encounter (Signed)
  Chief Complaint: black feces, reflux of blood  Symptoms: abdominal pain, left side of abdomen, burping, sweating hands, back pain Frequency: 1 week  Pertinent Negatives: Patient denies dizziness, Disposition: [] ED /[] Urgent Care (no appt availability in office) / [] Appointment(In office/virtual)/ []  [place] [facility]/ [] Home Care/ [x] Refused Recommended Disposition /[] [place] [facility]/ []  Follow-up with PCP Additional Notes: discussed potential complications (hemorrhage, dizziness) Reason for Disposition  Black or tarry bowel movements  (Exception: Chronic-unchanged black-grey BMs AND is taking iron pills or Pepto-Bismol.)  Answer Assessment - Initial Assessment Questions 1. LOCATION: "Where does it hurt?"      Lest side of abdomen- "like something is obstructing it" pressure  2. RADIATION: "Does the pain shoot anywhere else?" (e.g., chest, back)     back 3. ONSET: "When did the pain begin?" (e.g., minutes, hours or days ago)      1 week spit pink blood  4. SUDDEN: "Gradual or sudden onset?"     Sudden 5. PATTERN "Does the pain come and go, or is it constant?"    - If it comes and goes: "How long does it last?" "Do you have pain now?"     (Note: Comes and goes means the pain is intermittent. It goes away completely between bouts.)    - If constant: "Is it getting better, staying the same, or getting worse?"      (Note: Constant means the pain never goes away completely; most serious pain is constant and gets worse.)      Constant 6. SEVERITY: "How bad is the pain?"  (e.g., Scale 1-10; mild, moderate, or severe)    - MILD (1-3): Doesn't interfere with normal activities, abdomen soft and not tender to touch.     - MODERATE (4-7): Interferes with normal activities or awakens from sleep, abdomen tender to touch.     - SEVERE (8-10): Excruciating pain, doubled over, unable to do any normal activities.       mild 7. RECURRENT SYMPTOM: "Have you ever had this type of stomach pain  before?" If Yes, ask: "When was the last time?" and "What happened that time?"      Comes and goes 8. CAUSE: "What do you think is causing the stomach pain?"     N/a 9. RELIEVING/AGGRAVATING FACTORS: "What makes it better or worse?" (e.g., antacids, bending or twisting motion, bowel movement)     N/a 10. OTHER SYMPTOMS: "Do you have any other symptoms?" (e.g., back pain, diarrhea, fever, urination pain, vomiting)       sweating, black feces yesterday x 2 this am x 1, burping  11. PREGNANCY: "Is there any chance you are pregnant?" "When was your last menstrual period?"       N/a  Protocols used: Abdominal Pain - [facility]

## 2022-05-27 NOTE — Telephone Encounter (Signed)
Call placed interpreter [phone]. Unable  to reach patient message to return call to address concerns.

## 2022-05-28 ENCOUNTER — Emergency Department (HOSPITAL_COMMUNITY): Payer: Self-pay

## 2022-05-28 ENCOUNTER — Other Ambulatory Visit: Payer: Self-pay

## 2022-05-28 ENCOUNTER — Emergency Department (HOSPITAL_COMMUNITY)
Admission: EM | Admit: 2022-05-28 | Discharge: 2022-05-28 | Disposition: A | Discharge status: Home / Self Care | Payer: Self-pay | Attending: Emergency Medicine | Admitting: Emergency Medicine

## 2022-05-28 ENCOUNTER — Encounter (HOSPITAL_COMMUNITY): Payer: Self-pay | Admitting: *Deleted

## 2022-05-28 DIAGNOSIS — R1084 Generalized abdominal pain: Secondary | ICD-10-CM

## 2022-05-28 DIAGNOSIS — K2950 Unspecified chronic gastritis without bleeding: Secondary | ICD-10-CM | POA: Insufficient documentation

## 2022-05-28 LAB — CBC WITH DIFFERENTIAL/PLATELET
Abs Immature Granulocytes: 0.01 10*3/uL (ref 0.00–0.07)
Basophils Absolute: 0 10*3/uL (ref 0.0–0.1)
Basophils Relative: 1 %
Eosinophils Absolute: 0.1 10*3/uL (ref 0.0–0.5)
Eosinophils Relative: 2 %
HCT: 43 % (ref 36.0–46.0)
Hemoglobin: 13.7 g/dL (ref 12.0–15.0)
Immature Granulocytes: 0 %
Lymphocytes Relative: 30 %
Lymphs Abs: 1.3 10*3/uL (ref 0.7–4.0)
MCH: 27.5 pg (ref 26.0–34.0)
MCHC: 31.9 g/dL (ref 30.0–36.0)
MCV: 86.3 fL (ref 80.0–100.0)
Monocytes Absolute: 0.7 10*3/uL (ref 0.1–1.0)
Monocytes Relative: 16 %
Neutro Abs: 2.3 10*3/uL (ref 1.7–7.7)
Neutrophils Relative %: 51 %
Platelets: 275 10*3/uL (ref 150–400)
RBC: 4.98 MIL/uL (ref 3.87–5.11)
RDW: 14 % (ref 11.5–15.5)
WBC: 4.4 10*3/uL (ref 4.0–10.5)
nRBC: 0 % (ref 0.0–0.2)

## 2022-05-28 LAB — COMPREHENSIVE METABOLIC PANEL
ALT: 17 U/L (ref 0–44)
AST: 22 U/L (ref 15–41)
Albumin: 3.6 g/dL (ref 3.5–5.0)
Alkaline Phosphatase: 59 U/L (ref 38–126)
Anion gap: 11 (ref 5–15)
BUN: <5 mg/dL — ABNORMAL LOW (ref 6–20)
CO2: 26 mmol/L (ref 22–32)
Calcium: 9 mg/dL (ref 8.9–10.3)
Chloride: 104 mmol/L (ref 98–111)
Creatinine, Ser: 0.48 mg/dL (ref 0.44–1.00)
GFR, Estimated: >60 mL/min (ref >60)
Glucose, Bld: 107 mg/dL — ABNORMAL HIGH (ref 70–99)
Potassium: 3.7 mmol/L (ref 3.5–5.1)
Sodium: 141 mmol/L (ref 135–145)
Total Bilirubin: 0.4 mg/dL (ref 0.3–1.2)
Total Protein: 6.9 g/dL (ref 6.5–8.1)

## 2022-05-28 LAB — POC OCCULT BLOOD, ED: Fecal Occult Bld: NEGATIVE

## 2022-05-28 LAB — LIPASE, BLOOD: Lipase: 27 U/L (ref 11–51)

## 2022-05-28 LAB — I-STAT BETA HCG BLOOD, ED (MC, WL, AP ONLY): I-stat hCG, quantitative: <5 m[IU]/mL (ref <5)

## 2022-05-28 LAB — TYPE AND SCREEN
ABO/RH(D): O POS
Antibody Screen: NEGATIVE

## 2022-05-28 LAB — PROTIME-INR
INR: 1 (ref 0.8–1.2)
Prothrombin Time: 13.1 seconds (ref 11.4–15.2)

## 2022-05-28 LAB — ABO/RH: ABO/RH(D): O POS

## 2022-05-28 MED ORDER — PANTOPRAZOLE SODIUM 40 MG IV SOLR
40.0000 mg | Freq: Once | INTRAVENOUS | Status: AC
  Administered 2022-05-28: 40 mg via INTRAVENOUS
  Filled 2022-05-28: qty 10

## 2022-05-28 MED ORDER — IOHEXOL 350 MG/ML SOLN
75.0000 mL | Freq: Once | INTRAVENOUS | Status: AC | PRN
  Administered 2022-05-28: 75 mL via INTRAVENOUS

## 2022-05-28 MED ORDER — PANTOPRAZOLE SODIUM 40 MG PO TBEC
40.0000 mg | DELAYED_RELEASE_TABLET | Freq: Every day | ORAL | 0 refills | Status: DC
  Filled 2022-05-31: qty 30, 30d supply, fill #0

## 2022-05-28 MED ORDER — SUCRALFATE 1 GM/10ML PO SUSP
1.0000 g | Freq: Three times a day (TID) | ORAL | 0 refills | Status: DC
  Filled 2022-05-31: qty 420, 11d supply, fill #0

## 2022-05-28 MED ORDER — FAMOTIDINE 20 MG PO TABS
20.0000 mg | ORAL_TABLET | Freq: Once | ORAL | Status: AC
  Administered 2022-05-28: 20 mg via ORAL
  Filled 2022-05-28: qty 1

## 2022-05-28 MED ORDER — SODIUM CHLORIDE 0.9 % IV BOLUS
1000.0000 mL | Freq: Once | INTRAVENOUS | Status: AC
  Administered 2022-05-28: 1000 mL via INTRAVENOUS

## 2022-05-28 MED ORDER — FAMOTIDINE 20 MG PO TABS
20.0000 mg | ORAL_TABLET | Freq: Two times a day (BID) | ORAL | 0 refills | Status: DC
  Filled 2022-05-31: qty 60, 30d supply, fill #0

## 2022-05-28 NOTE — ED Triage Notes (Signed)
The pt has had abd pain for one week

## 2022-05-28 NOTE — Discharge Instructions (Signed)
At this time there does not appear to be the presence of an emergent medical condition, however there is always the potential for conditions to change. Please read and follow the below instructions.  Please return to the Emergency Department immediately for any new or worsening symptoms. Please be sure to follow up with your Primary Care Provider within one week regarding your visit today; please call their office to schedule an appointment even if you are feeling better for a follow-up visit. Please take the medications Protonix, Pepcid and Carafate as prescribed. Please call your gastroenterologist office today to schedule a follow-up appointment. Your CT scan showed a small cyst in your liver.  Please discuss this finding with your primary care provider and gastroenterologist at your follow-up appointments.   Please read the additional information packets attached to your discharge summary.  Go to the nearest Emergency Department immediately if: You have fever or chills Your pain does not go away as soon as your doctor says it should. You cannot stop vomiting. Your pain is only in areas of your belly, such as the right side or the left lower part of the belly. You have bloody or black poop, or poop that looks like tar. You have very bad pain, cramping, or bloating in your belly. You have signs of not having enough fluid or water in your body (dehydration), such as: Dark pee, very little pee, or no pee. Cracked lips. Dry mouth. Sunken eyes. Sleepiness. Weakness. You have trouble breathing or chest pain. You have any new/concerning or worsening of symptoms.  Do not take your medicine if  develop an itchy rash, swelling in your mouth or lips, or difficulty breathing; call 911 and seek immediate emergency medical attention if this occurs.  You may review your lab tests and imaging results in their entirety on your MyChart account.  Please discuss all results of fully with your primary  care provider and other specialist at your follow-up visit.  Note: Portions of this text may have been transcribed using voice recognition software. Every effort was made to ensure accuracy; however, inadvertent computerized transcription errors may still be present. ============== Below has been translated using Google translate.  Errors may be present.  Interpret with caution.  A continuacin se ha traducido con Soil scientist. Puede haber errores. Interprete con precaucin. ================= En este momento no parece haber una condicin mdica emergente, sin embargo, siempre existe la posibilidad de que las condiciones [place]. Lea y siga las instrucciones a continuacin.  1. Regrese al Departamento de Emergencias de inmediato si presenta algn sntoma nuevo o que empeore. 2. Asegrese de hacer un seguimiento con su proveedor de atencin primaria dentro de una semana con respecto a su visita de hoy; Llame a su consultorio para programar una cita incluso si se siente mejor para una visita de seguimiento. 3. Tome los [employer], Pepcid y Carafate segn lo prescrito. 4. Llame hoy al consultorio de su gastroenterlogo para programar una cita de seguimiento. 5. Su tomografa computarizada mostr un pequeo quiste en su hgado. Discuta este hallazgo con su proveedor de atencin primaria y Architect en sus citas de seguimiento.   Lea los paquetes de informacin adicional adjuntos a su resumen de alta.  Acuda inmediatamente al Departamento de Emergencias ms cercano si: ? Tienes fiebre o escalofros. ? Su dolor no desaparece tan pronto como su mdico le indica que debera [place]. ? No puedes dejar de vomitar. ? Su dolor es slo en reas de su abdomen, como  el lado derecho o la parte inferior izquierda del abdomen. ? Tienes caca con sangre, negra o que parece alquitrn. ? Tiene dolores muy intensos, calambres o hinchazn en el abdomen. ? Tiene signos de no tener  suficiente lquido o agua en su cuerpo (deshidratacin), como: ? [place], muy poca o nada de [place]. ? Labios agrietados. ? [employer]. ? Ojos hundidos. ? Somnolencia. ? Debilidad. ? Tiene dificultad para respirar o Journalist, newspaper. ? Tiene algn sntoma nuevo/preocupante o que empeora.  No tome su medicamento si desarrolla sarpullido con picazn, hinchazn en la boca o los labios o dificultad para respirar; llame al 911 y busque atencin mdica de emergencia inmediata si esto ocurre.  Puede revisar sus pruebas de laboratorio y [facility] de imgenes en su totalidad en su cuenta MyChart. Analice todos los resultados de forma completa con su proveedor de atencin primaria y otro especialista en su visita de seguimiento.

## 2022-05-28 NOTE — ED Provider Notes (Addendum)
[facility] EMERGENCY DEPARTMENT Provider Note   CSN: [id number] Arrival date & time: 05/28/22  0449     History  Chief Complaint  Patient presents with   Abdominal Pain   Spanish interpreter used throughout this visit Hayley Garcia is a 46 y.o. female with history of gastritis presented for upper abdominal pain.  Patient reports that she has been dealing with this problem off and on for over 1 year and has seen gastroenterology and had upper endoscopy which showed gastritis.  Patient reports current flareup of abdominal pain began around 1 week ago she describes epigastric pain which is severe burning nonradiating no clear aggravating relieving factors.  She reports is associated with several episodes of bloody emesis patient is also noted bloody stool over the past few days as well.  Patient does report feeling warm 2 days ago and thought she had a fever but she did not measure a temperature.  Patient reports some lightheadedness yesterday that has resolved.  Denies chest pain, alcohol use or any additional concerns HPI     Home Medications Prior to Admission medications   Medication Sig Start Date End Date Taking? Authorizing Provider  famotidine (PEPCID) 20 MG tablet Take 1 tablet (20 mg total) by mouth 2 (two) times daily. 05/28/22 06/27/22 Yes Harlene Salts A, PA-C  pantoprazole (PROTONIX) 40 MG tablet Take 1 tablet (40 mg total) by mouth daily. 05/28/22 06/27/22 Yes Harlene Salts A, PA-C  sucralfate (CARAFATE) 1 GM/10ML suspension Take 10 mLs (1 g total) by mouth 4 (four) times daily -  with meals and at bedtime. 05/28/22  Yes Harlene Salts A, PA-C  albuterol (VENTOLIN HFA) 108 (90 Base) MCG/ACT inhaler Inhale 2 puffs into the lungs every 6 (six) hours as needed for wheezing or shortness of breath. 01/13/22   Hoy Register, MD  cetirizine (ZYRTEC) 10 MG tablet Take 1 tablet (10 mg total) by mouth daily. 01/13/22   Hoy Register, MD   FLUoxetine (PROZAC) 20 MG capsule Take 1 capsule (20 mg total) by mouth daily. 03/03/22   Hoy Register, MD  fluticasone (FLONASE) 50 MCG/ACT nasal spray Place 2 sprays into both nostrils daily. 01/13/22   Hoy Register, MD      Allergies    Patient has no known allergies.    Review of Systems   Review of Systems Ten systems are reviewed and are negative for acute change except as noted in the HPI  Physical Exam Updated Vital Signs BP (!) 143/130   Pulse 74   Temp 98.9 F (37.2 C)   Resp 20   Ht 4\' 11"  (1.499 m)   Wt 53.1 kg   SpO2 98%   BMI 23.64 kg/m  Physical Exam Constitutional:      General: She is not in acute distress.    Appearance: Normal appearance. She is well-developed. She is not ill-appearing or diaphoretic.  HENT:     Head: Normocephalic and atraumatic.  Eyes:     General: Vision grossly intact. Gaze aligned appropriately.     Pupils: Pupils are equal, round, and reactive to light.  Neck:     Trachea: Trachea and phonation normal.  Pulmonary:     Effort: Pulmonary effort is normal. No respiratory distress.  Abdominal:     General: There is no distension.     Palpations: Abdomen is soft.     Tenderness: There is abdominal tenderness in the epigastric area. There is no guarding or rebound.  Genitourinary:    Comments:  Rectal examination chaperoned by Eula Listen.  Interpreter also present for communication during procedure.  External examination unremarkable, no hemorrhoids or fissures.  Normal rectal tone.  No palpable internal fissures.  No gross blood.  Light brown stool collected for Hemoccult. Musculoskeletal:        General: Normal range of motion.     Cervical back: Normal range of motion.  Skin:    General: Skin is warm and dry.  Neurological:     Mental Status: She is alert.     GCS: GCS eye subscore is 4. GCS verbal subscore is 5. GCS motor subscore is 6.     Comments: Speech is clear and goal oriented, follows commands Major Cranial nerves  without deficit, no facial droop Moves extremities without ataxia, coordination intact  Psychiatric:        Behavior: Behavior normal.     ED Results / Procedures / Treatments   Labs (all labs ordered are listed, but only abnormal results are displayed) Labs Reviewed  COMPREHENSIVE METABOLIC PANEL - Abnormal; Notable for the following components:      Result Value   Glucose, Bld 107 (*)    BUN <5 (*)    All other components within normal limits  CBC WITH DIFFERENTIAL/PLATELET  PROTIME-INR  LIPASE, BLOOD  I-STAT BETA HCG BLOOD, ED ([facility], [facility], [facility] ONLY)  POC OCCULT BLOOD, ED  TYPE AND SCREEN  ABO/RH    EKG None  Radiology CT ABDOMEN PELVIS W CONTRAST  Result Date: 05/28/2022 CLINICAL DATA:  Worsening epigastric pain for 1 week. Hematemesis. Melena. EXAM: CT ABDOMEN AND PELVIS WITH CONTRAST TECHNIQUE: Multidetector CT imaging of the abdomen and pelvis was performed using the standard protocol following bolus administration of intravenous contrast. RADIATION DOSE REDUCTION: This exam was performed according to the departmental dose-optimization program which includes automated exposure control, adjustment of the mA and/or kV according to patient size and/or use of iterative reconstruction technique. CONTRAST:  35mL OMNIPAQUE IOHEXOL 350 MG/ML SOLN COMPARISON:  02/21/2021 FINDINGS: Lower Chest: No acute findings. Hepatobiliary: Small cyst again noted in the anterior liver dome. No suspicious liver lesions identified Gallbladder is unremarkable. No evidence of biliary ductal dilatation. Pancreas:  No mass or inflammatory changes. Spleen: Within normal limits in size and appearance. Adrenals/Urinary Tract: No suspicious masses identified. No evidence of ureteral calculi or hydronephrosis. Stomach/Bowel: No evidence of obstruction, inflammatory process or abnormal fluid collections. Normal appendix visualized. Vascular/Lymphatic: No pathologically enlarged lymph nodes. No acute vascular findings.  Reproductive:  No mass or other significant abnormality. Other:  None. Musculoskeletal:  No suspicious bone lesions identified. IMPRESSION: No acute findings or other significant abnormality within the abdomen or pelvis. Electronically Signed   By: Danae Orleans M.D.   On: 05/28/2022 10:39    Procedures Procedures    Medications Ordered in ED Medications  pantoprazole (PROTONIX) injection 40 mg (40 mg Intravenous Given 05/28/22 0826)  sodium chloride 0.9 % bolus 1,000 mL (0 mLs Intravenous Stopped 05/28/22 1109)  iohexol (OMNIPAQUE) 350 MG/ML injection 75 mL (75 mLs Intravenous Contrast Given 05/28/22 1024)    ED Course/ Medical Decision Making/ A&P Clinical Course as of 05/28/22 1437  Sat May 28, 2022  0854 CBC with Differential CBC within normal limits, no anemia.  No leukocytosis to suggest infectious process.  No thrombocytopenia. [BM]  0855 Comprehensive metabolic panel(!) CMP without emergent electrolyte derangement, AKI, LFT elevations or gap. [BM]  0855 I-Stat beta hCG blood, ED Beta-hCG negative [BM]  0855 Protime-INR INR within normal limits [BM]  0855 ABO/Rh O+ [BM]  0855 POC occult blood, ED Hemoccult negative [BM]  1033 CT ABDOMEN PELVIS W CONTRAST I have personally reviewed and interpreted patient's CT abdomen pelvis.  I do not appreciate any obvious free air or obstruction.  Awaiting radiologist interpretation. [BM]  1116 CT ABDOMEN PELVIS W CONTRAST Radiologist interpretation as negative for acute findings. [BM]  1117 Lipase, blood Lipase within normal limits [BM]  1229 Need EKG [BM]  1414 ED EKG I have personally reviewed and interpreted patient's twelve-lead EKG.  I do not appreciate any obvious acute ischemic changes.  Shows normal sinus rhythm.  Reviewed with Dr. Rodena Medin [BM]    Clinical Course User Index [BM] Bill Salinas, PA-C                           Medical Decision Making Joyell Emami Chip Boer is a 46 y.o. female with history of gastritis  presented for upper abdominal pain.  Patient reports that she has been dealing with this problem off and on for over 1 year and has seen gastroenterology and had upper endoscopy which showed gastritis.  Patient reports current flareup of abdominal pain began around 1 week ago she describes epigastric pain which is severe burning nonradiating no clear aggravating relieving factors.  She reports is associated with several episodes of bloody emesis patient is also noted bloody stool over the past few days as well.  Patient does report feeling warm 2 days ago and thought she had a fever but she did not measure a temperature.  Patient reports some lightheadedness yesterday that has resolved.  Denies chest pain, alcohol use or any additional concerns ---------------------------------------- Differential includes but not limited to GERD, gastric ulcer, perforation, cholecystitis, choledocholithiasis, pancreatitis, obstruction, GI bleed.  Plan to obtain abdominal pain labs, Hemoccult, CT abdomen pelvis.  Plan discussed with Dr. Rodena Medin who agrees.  Amount and/or Complexity of Data Reviewed External Data Reviewed: notes.    Details:   ER visit from 03/12/2022 patient was seen for abdominal pain.  Workup at that time was reassuring.  She had workup including CBC, CMP, lipase, beta-hCG, urinalysis, EKG, troponin and ultrasound of the right upper quadrant.  Patient received GI cocktail along with pantoprazole.  Discharge diagnoses epigastric pain.  Reviewed upper endoscopy from 07/01/2021 performed by Dr. Tomasa Rand.  Findings included normal esophagus, normal stomach, normal duodenum recommended trial of omeprazole 20 mg p.o. x 4 weeks and follow-up in their clinic.  Reviewed most recent gastroenterology office visit 09/30/2021, she had been complaining of upper GI symptoms gastric biopsies showed reactive gastropathy with mild chronic gastritis negative for H. pylori.  She did have a positive H. pylori breath  test and was treated with amoxicillin, clarithromycin and omeprazole.  Assessment and plan at that time was GERD with epigastric abdominal burning.  Plan at that time was to discontinue omeprazole and start pantoprazole (patient reports only taking omeprazole at this time), they discussed diet changes.   Labs:  Decision-making details documented in ED Course. Radiology: ordered and independent interpretation performed. Decision-making details documented in ED Course. ECG/medicine tests: ordered and independent interpretation performed. Decision-making details documented in ED Course.  Risk Prescription drug management. Risk Details:   Patient's workup today is overall reassuring.  She was reassessed she is sleeping comfortably bed no acute distress.  No recurrence of emesis or bloody stools while here in the ER.  Patient's child is playing on the bed with her.  Reassessment patient's abdomen reveals  a soft nontender abdomen.  Spanish Radiation protection practitionervideo translator was used and discussion of results above and patient stated understanding.  Suspect patient is experiencing a flare of her gastritis, she tells me that she is only taking omeprazole.  We will have her discontinue this as most recent gastroenterology notes has her taking the Protonix.  Will prescribe her Protonix Carafate and Pepcid to help with her symptoms.  I have asked the patient to call her gastroenterology team to schedule follow-up appointment and also to follow-up with her primary care provider for recheck as well.   Low suspicion for acute GI bleed, SBO, cholecystitis, pancreatitis, perforation, atypical ACS or other emergent causes of patient's chronic upper abdominal pain at this point.  Strict ER return precautions discussed.  Spanish video interpreter was used throughout visit including history taking , physical examination and discussion of results/discharge instructions. - Addendum: Patient requested first dose of Pepcid before she left,  this was given by RN.   At this time there does not appear to be any evidence of an acute emergency medical condition and the patient appears stable for discharge with appropriate outpatient follow up. Diagnosis was discussed with patient who verbalizes understanding of care plan and is agreeable to discharge. I have discussed return precautions with patient who verbalizes understanding. Patient encouraged to follow-up with their PCP and gastroenterology. All questions answered.  Patient's case discussed with Dr. Rodena MedinMessick who agrees with plan to discharge with follow-up.   Note: Portions of this report may have been transcribed using voice recognition software. Every effort was made to ensure accuracy; however, inadvertent computerized transcription errors may still be present.         Final Clinical Impression(s) / ED Diagnoses Final diagnoses:  Generalized abdominal pain  Chronic gastritis, presence of bleeding unspecified, unspecified gastritis type    Rx / DC Orders ED Discharge Orders          Ordered    sucralfate (CARAFATE) 1 GM/10ML suspension  3 times daily with meals & bedtime        05/28/22 1425    pantoprazole (PROTONIX) 40 MG tablet  Daily        05/28/22 1425    famotidine (PEPCID) 20 MG tablet  2 times daily        05/28/22 1425              Bill SalinasMorelli, Romy Ipock A, [place] JerseyPA-C 05/28/22 1437    Bill SalinasMorelli, Kevin Mario A, PA-C 05/28/22 1440    Wynetta FinesMessick, Peter C, MD 05/28/22 [phone]

## 2022-05-28 NOTE — ED Provider Triage Note (Signed)
Emergency Medicine Provider Triage Evaluation Note  Hayley Garcia , a 46 y.o. female  was evaluated in triage.  Pt complains of upper abdominal pain.  Records show that this has been a recurrent problem for over a year.  Patient has had testing and treatment for H. pylori.  Patient presents today with worsening upper abdominal pain over the past 1 week.  She has had episodes of vomiting with blood noted in the vomit.  She also notes passage of dark stool.  States that she has never received the blood for infusion.  No lightheadedness or syncope.  She states that she has been short of breath at times.  Denies heavy NSAID or alcohol use.  She states she did take ibuprofen for fever 2 days ago.  No use of anticoagulation.  Spanish interpreter required.  Review of Systems  Positive: Abdominal pain, vomiting Negative: Syncope  Physical Exam  BP 114/83   Pulse 93   Temp 98.1 F (36.7 C) (Oral)   Resp 18   SpO2 100%  Gen:   Awake, no distress   Resp:  Normal effort  MSK:   Moves extremities without difficulty  Other:    Medical Decision Making  Medically screening exam initiated at 5:24 AM.  Appropriate orders placed.  Hayley Garcia was informed that the remainder of the evaluation will be completed by another provider, this initial triage assessment does not replace that evaluation, and the importance of remaining in the ED until their evaluation is complete.     Renne Crigler, PA-C 05/28/22 [phone]

## 2022-05-31 ENCOUNTER — Other Ambulatory Visit: Payer: Self-pay

## 2022-05-31 NOTE — Telephone Encounter (Signed)
2rd call placed to patient using interpreter(#[phone]) Message left to call office to address concerns

## 2022-06-01 ENCOUNTER — Ambulatory Visit: Payer: Self-pay | Admitting: *Deleted

## 2022-06-01 NOTE — Telephone Encounter (Signed)
Summary: Medication Management   Pt stated she picked up the medication sucralfate (CARAFATE) 1 GM/10ML suspension however, she does not know how she should be taking it.  Pt seeking clinical advice. Please call pt with Spanish Interpreter.         Chief Complaint: Med Question Symptoms: NA Frequency: NA Pertinent Negatives: Patient denies NA Disposition: [] ED /[] Urgent Care (no appt availability in office) / [] Appointment(In office/virtual)/ []  [place] [facility]/ [] Home Care/ [] Refused Recommended Disposition /[] [place] [facility]/ []  Follow-up with PCP Additional Notes:  Pt had questions regarding how to take carafate and pepcid. Advised pt to her satisfaction as per PCP orders. Assisted by Interpreter Denny Peon [phone]  Answer Assessment - Initial Assessment Questions 1. NAME of MEDICINE: "What medicine(s) are you calling about?"     Carafate and pepcid 2. QUESTION: "What is your question?" (e.g., double dose of medicine, side effect)     How to take 3. PRESCRIBER: "Who prescribed the medicine?" Reason: if prescribed by specialist, call should be referred to that group.     PCP  Protocols used: Medication Question Call-A-AH

## 2022-06-08 ENCOUNTER — Telehealth: Payer: Self-pay | Admitting: *Deleted

## 2022-06-08 NOTE — Telephone Encounter (Signed)
Noted patient has appointment on 06/16/2022

## 2022-06-08 NOTE — Telephone Encounter (Signed)
Interpreter: Hayley Garcia [phone] Patient was seen at ED 12/30- abdominal pain- patient states she still has heaviness in abdomen and she has appointment with GI tomorrow. Reviewed labs/scan results- all negative/normal. Patient is concerned about her glucose level. Advised to discuss with provider at next visit- can not find that testing in labs

## 2022-06-09 ENCOUNTER — Ambulatory Visit: Payer: Self-pay | Admitting: Family Medicine

## 2022-06-09 ENCOUNTER — Encounter: Payer: Self-pay | Admitting: Gastroenterology

## 2022-06-09 ENCOUNTER — Other Ambulatory Visit: Payer: Self-pay

## 2022-06-09 ENCOUNTER — Ambulatory Visit (INDEPENDENT_AMBULATORY_CARE_PROVIDER_SITE_OTHER): Payer: Self-pay | Admitting: Gastroenterology

## 2022-06-09 VITALS — BP 110/86 | HR 84 | Ht 59.0 in | Wt 113.0 lb

## 2022-06-09 DIAGNOSIS — K920 Hematemesis: Secondary | ICD-10-CM

## 2022-06-09 DIAGNOSIS — R1084 Generalized abdominal pain: Secondary | ICD-10-CM

## 2022-06-09 MED ORDER — AMITRIPTYLINE HCL 10 MG PO TABS
10.0000 mg | ORAL_TABLET | Freq: Every day | ORAL | 3 refills | Status: DC
  Filled 2022-06-09: qty 30, 30d supply, fill #0
  Filled 2022-07-07: qty 30, 30d supply, fill #1
  Filled 2022-08-12: qty 30, 30d supply, fill #2
  Filled 2022-09-02: qty 30, 30d supply, fill #3
  Filled 2022-10-04: qty 30, 30d supply, fill #4
  Filled 2022-11-08: qty 30, 30d supply, fill #5
  Filled 2022-12-09: qty 30, 30d supply, fill #6
  Filled 2023-01-10: qty 30, 30d supply, fill #7
  Filled 2023-03-31: qty 30, 30d supply, fill #8
  Filled 2023-04-24: qty 30, 30d supply, fill #9
  Filled ????-??-??: fill #1

## 2022-06-09 NOTE — Patient Instructions (Addendum)
__________________________________________Si tiene 65 aos o ms, su ndice de masa corporal debe estar entre 23 y 62. Su ndice de masa corporal es de 22,82 kg/m. Si esto est fuera del rango mencionado anteriormente, considere realizar un seguimiento con su proveedor de Midwife.  Si tiene 67 aos o menos, su ndice de [employer] corporal debe estar entre 46 y 67. Su ndice de masa corporal es de 22,82 kg/m. Si esto est fuera del rango mencionado anteriormente, considere realizar un seguimiento con su proveedor de Midwife.  Le han programado una endoscopia. Siga las instrucciones escritas que se le dieron en su visita de hoy. Si [place] inhaladores (incluso solo cuando sea necesario), trigalos con usted el da de su procedimiento.  Hemos enviado los siguientes medicamentos a su farmacia para que los recoja cuando le convenga: Elavil 10 mg al [employer].  Los proveedores de Financial controller GI desean alentarlo a Risk manager MYCHART para comunicarse con los proveedores para solicitudes o preguntas que no sean urgentes. Debido a los Astronomer de espera en el telfono, enviar un mensaje a su proveedor a travs de [employer] puede ser Ardelia Mems manera ms rpida y eficiente de obtener una respuesta. Espere 48 horas hbiles para recibir [employer]. Recuerde que esto es para solicitudes no urgentes.  Fue un placer verte hoy!  Gracias por confiarme tu cuidado gastrointestinal!  Scott E. Candis Schatz, MD

## 2022-06-09 NOTE — Progress Notes (Signed)
HPI : Hayley Garcia is a very pleasant 47 year old female with a history of anxiety who I initially saw in September 2022 with symptoms of chronic generalized abdominal pain, presents to GI clinic for ongoing abdominal pain.  She states that her pain has not improved at all in the past year; in fact, it seems to be worse.  She has pain everyday and it never goes away.  She describes variable types of pain, from a burning sensation, to pressure-like sensation or 'weight' in her abdomen.  She has decreased appetite and reports unintentional weight loss.  The pain sometimes radiates into her back. She has recurrent vomiting and recently vomited blood on multiple occasions, prompting a visit to the ED on Dec 30th.  In the ED, she was found to have normal vitals, normal CBC and CMP, normal rectal exam and FOBT and a normal CT abd/pelvis. She was discharged from the ED with Protonix, carafate and Zofran. She continues to report abdominal pain, but has not vomited since Dec 30th. She reports that she is struggling with anxiety and had a panic attack the day she went to the ED.  She states that she is worried she has cancer and she is worried about who will care for her young daughter if she has cancer. She has regular bowel movements, sometimes with hard stools/straining.    RUQUS Sept 24, 2022 IMPRESSION: Unremarkable right upper quadrant ultrasound.   CT abdomen/pelvis Sept 25, 2022 IMPRESSION: 1. No acute abnormality in the abdomen/pelvis. 2. Septated 3.9 cm left ovarian cyst. There may be some peripheral septal thickening. Because this lesion is not adequately characterized, prompt [place] is recommended for further evaluation. Note: This recommendation does not apply to premenarchal patients and to those with increased risk (genetic, family history, elevated tumor markers or other high-risk factors) of ovarian cancer. Reference: JACR 2020 Feb; 17(2):248-254 3. Small fat containing umbilical  hernia.  EGD:  Jul 01, 2021 Normal Gastric biopisies with reactive gastropathy, mild chronic gastritis, H. Pylori neg  Colonoscopy:  Jul 01, 2021 Normal colon/terminal ileum; repeat in 10 years  RUQUS September 09, 2021 IMPRESSION: Hepatic steatosis without focal liver lesions.  RUQUS  Mar 12, 2022 IMPRESSION: Stable fatty liver. No acute abnormality noted  CT abd/pelvis May 28, 2022 IMPRESSION: No acute findings or other significant abnormality within the abdomen or pelvis.    Colonoscopy, Jul 01, 2021 Normal   Component Ref Range & Units 12 d ago  Fecal Occult Bld NEGATIVE NEGATIVE   Component Ref Range & Units 12 d ago 2 mo ago 9 mo ago 11 mo ago 1 yr ago  Lipase 11 - 51 U/L 27 30 CM 31 CM 27 R 32    omponent Ref Range & Units 12 d ago (05/28/22) 2 mo ago (03/17/22) 2 mo ago (03/12/22) 9 mo ago (09/09/21) 1 yr ago (02/20/21) 1 yr ago (08/12/20)  WBC 4.0 - 10.5 K/uL 4.4 9.7 R 10.1 12.2 High  12.5 High  8.7 R  RBC 3.87 - 5.11 MIL/uL 4.98 5.03 R 5.04 4.73 4.76 5.33 High  R  Hemoglobin 12.0 - 15.0 g/dL 02.5 85.2 R 77.8 24.2 35.3 15.1 R  HCT 36.0 - 46.0 % 43.0 43.6 R 45.1 41.6 43.0 46.5 R  MCV 80.0 - 100.0 fL 86.3 87 R 89.5 87.9 90.3 87 R  MCH 26.0 - 34.0 pg 27.5 27.6 R 27.8 28.1 27.9 28.3 R  MCHC 30.0 - 36.0 g/dL 61.4 43.1 R 54.0 08.6 76.1 32.5  R  RDW 11.5 - 15.5 % 14.0 13.9 R 14.9 14.0 13.5 13.2 R  Platelets 150 - 400 K/uL 275 324 R 356 333 384 338 R   Component Ref Range & Units 2 mo ago (03/17/22) 1 yr ago (12/10/20) 1 yr ago (10/22/20)  H pylori Breath Test Negative Negative Negative Positive Abnormal     Past Medical History:  Diagnosis Date   Anemia    hx of   Anxiety    not on meds at this time   Back pain    Chronic headaches    Continuous urine leakage    Cough    Excessive thirst    GERD (gastroesophageal reflux disease)    not on meds as of 06/17/2021   Hepatic steatosis    Menstrual pain    Night sweats    Pre-diabetes    Shortness of breath     Sore throat    Umbilical hernia    Vision changes      Past Surgical History:  Procedure Laterality Date   ANKLE SURGERY Left    pin   Family History  Problem Relation Age of Onset   Colon polyps Neg Hx    Colon cancer Neg Hx    Esophageal cancer Neg Hx    Rectal cancer Neg Hx    Stomach cancer Neg Hx    Social History   Tobacco Use   Smoking status: Never   Smokeless tobacco: Never  Vaping Use   Vaping Use: Never used  Substance Use Topics   Alcohol use: Not Currently   Drug use: Never   Current Outpatient Medications  Medication Sig Dispense Refill   cetirizine (ZYRTEC) 10 MG tablet Take 1 tablet (10 mg total) by mouth daily. 30 tablet 1   famotidine (PEPCID) 20 MG tablet Take 1 tablet (20 mg total) by mouth 2 (two) times daily. 60 tablet 0   albuterol (VENTOLIN HFA) 108 (90 Base) MCG/ACT inhaler Inhale 2 puffs into the lungs every 6 (six) hours as needed for wheezing or shortness of breath. 18 g 2   FLUoxetine (PROZAC) 20 MG capsule Take 1 capsule (20 mg total) by mouth daily. (Patient not taking: Reported on 06/09/2022) 30 capsule 3   fluticasone (FLONASE) 50 MCG/ACT nasal spray Place 2 sprays into both nostrils daily. (Patient not taking: Reported on 06/09/2022) 16 g 1   pantoprazole (PROTONIX) 40 MG tablet Take 1 tablet (40 mg total) by mouth daily. (Patient not taking: Reported on 06/09/2022) 30 tablet 0   sucralfate (CARAFATE) 1 GM/10ML suspension Take 10 mLs (1 g total) by mouth 4 (four) times daily -  with meals and at bedtime. (Patient not taking: Reported on 06/09/2022) 420 mL 0   No current facility-administered medications for this visit.   No Known Allergies   Review of Systems: All systems reviewed and negative except where noted in HPI.    CT ABDOMEN PELVIS W CONTRAST  Result Date: 05/28/2022 CLINICAL DATA:  Worsening epigastric pain for 1 week. Hematemesis. Melena. EXAM: CT ABDOMEN AND PELVIS WITH CONTRAST TECHNIQUE: Multidetector CT imaging of the  abdomen and pelvis was performed using the standard protocol following bolus administration of intravenous contrast. RADIATION DOSE REDUCTION: This exam was performed according to the departmental dose-optimization program which includes automated exposure control, adjustment of the mA and/or kV according to patient size and/or use of iterative reconstruction technique. CONTRAST:  66mL OMNIPAQUE IOHEXOL 350 MG/ML SOLN COMPARISON:  02/21/2021 FINDINGS: Lower Chest: No acute findings. Hepatobiliary:  Small cyst again noted in the anterior liver dome. No suspicious liver lesions identified Gallbladder is unremarkable. No evidence of biliary ductal dilatation. Pancreas:  No mass or inflammatory changes. Spleen: Within normal limits in size and appearance. Adrenals/Urinary Tract: No suspicious masses identified. No evidence of ureteral calculi or hydronephrosis. Stomach/Bowel: No evidence of obstruction, inflammatory process or abnormal fluid collections. Normal appendix visualized. Vascular/Lymphatic: No pathologically enlarged lymph nodes. No acute vascular findings. Reproductive:  No mass or other significant abnormality. Other:  None. Musculoskeletal:  No suspicious bone lesions identified. IMPRESSION: No acute findings or other significant abnormality within the abdomen or pelvis. Electronically Signed   By: Marlaine Hind M.D.   On: 05/28/2022 10:39    Physical Exam: BP 110/86   Pulse 84   Ht 4\' 11"  (1.499 m)   Wt 113 lb (51.3 kg)   BMI 22.82 kg/m  Constitutional: Pleasant,well-developed, Hispanic female in no acute distress.  Accompanied by medical translator. HEENT: Normocephalic and atraumatic. Conjunctivae are normal. No scleral icterus. Neck supple.  Cardiovascular: Normal rate, regular rhythm.  Pulmonary/chest: Effort normal and breath sounds normal. No wheezing, rales or rhonchi. Abdominal: Soft, nondistended, nontender. Bowel sounds active throughout. There are no masses palpable. No  hepatomegaly. Extremities: no edema Lymphadenopathy: No cervical adenopathy noted. Neurological: Alert and oriented to person place and time. Skin: Skin is warm and dry. No rashes noted. Psychiatric: Normal mood and affect. Behavior is normal.  CBC    Component Value Date/Time   WBC 4.4 05/28/2022 0535   RBC 4.98 05/28/2022 0535   HGB 13.7 05/28/2022 0535   HGB 13.9 03/17/2022 1019   HCT 43.0 05/28/2022 0535   HCT 43.6 03/17/2022 1019   PLT 275 05/28/2022 0535   PLT 324 03/17/2022 1019   MCV 86.3 05/28/2022 0535   MCV 87 03/17/2022 1019   MCH 27.5 05/28/2022 0535   MCHC 31.9 05/28/2022 0535   RDW 14.0 05/28/2022 0535   RDW 13.9 03/17/2022 1019   LYMPHSABS 1.3 05/28/2022 0535   LYMPHSABS 2.1 03/17/2022 1019   MONOABS 0.7 05/28/2022 0535   EOSABS 0.1 05/28/2022 0535   EOSABS 0.4 03/17/2022 1019   BASOSABS 0.0 05/28/2022 0535   BASOSABS 0.1 03/17/2022 1019    CMP     Component Value Date/Time   NA 141 05/28/2022 0535   NA 139 03/17/2022 1019   K 3.7 05/28/2022 0535   CL 104 05/28/2022 0535   CO2 26 05/28/2022 0535   GLUCOSE 107 (H) 05/28/2022 0535   BUN <5 (L) 05/28/2022 0535   BUN 12 03/17/2022 1019   CREATININE 0.48 05/28/2022 0535   CALCIUM 9.0 05/28/2022 0535   PROT 6.9 05/28/2022 0535   PROT 7.2 03/17/2022 1019   ALBUMIN 3.6 05/28/2022 0535   ALBUMIN 4.3 03/17/2022 1019   AST 22 05/28/2022 0535   ALT 17 05/28/2022 0535   ALKPHOS 59 05/28/2022 0535   BILITOT 0.4 05/28/2022 0535   BILITOT 0.3 03/17/2022 1019   GFRNONAA >60 05/28/2022 0535     ASSESSMENT AND PLAN: 47 year old female with anxiety and chronic abdominal pain with multiple negative ultrasounds, negative CT, normal EGD and colonoscopy and normal laboratory evaluation.  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  She had multiple episodes of hematemesis which did not result in hemodynamic instability or drop in hemoglobin.  Hematemesis may have been secondary to mallory  weiss tear vs pharyngeal source, but patient remains very concerned about possibility of cancer.  Although I reassured  her cancer was an extremely unlikely cause of her symptoms, a repeat EGD to evaluate hematemesis is not unreasonable.   Will plan for repeat EGD, but I would also like to start a TCA for her chronic abdominal pain.  We discussed the rationale for TCA treatment in chronic abdominal pain, and the potential side effects to include constipation, dry mouth, somnolence/unspecified mood changes, urinary retention.  Hematemesis - EGD  Chronic abdominal pain - Elavil 10 mg PO qhs  The details, risks (including bleeding, perforation, infection, missed lesions, medication reactions and possible hospitalization or surgery if complications occur), benefits, and alternatives to EGD with possible biopsy and possible dilation were discussed with the patient and she consents to proceed.   Shalaina Guardiola E. Candis Schatz, MD [facility]   Charlott Rakes, MD

## 2022-06-16 ENCOUNTER — Ambulatory Visit: Payer: Self-pay | Attending: Physician Assistant | Admitting: Physician Assistant

## 2022-06-16 ENCOUNTER — Encounter: Payer: Self-pay | Admitting: Physician Assistant

## 2022-06-16 ENCOUNTER — Other Ambulatory Visit: Payer: Self-pay

## 2022-06-16 VITALS — BP 109/73 | HR 81 | Wt 116.8 lb

## 2022-06-16 DIAGNOSIS — R630 Anorexia: Secondary | ICD-10-CM

## 2022-06-16 DIAGNOSIS — K219 Gastro-esophageal reflux disease without esophagitis: Secondary | ICD-10-CM

## 2022-06-16 DIAGNOSIS — R739 Hyperglycemia, unspecified: Secondary | ICD-10-CM

## 2022-06-16 DIAGNOSIS — Z789 Other specified health status: Secondary | ICD-10-CM

## 2022-06-16 MED ORDER — FAMOTIDINE 20 MG PO TABS
20.0000 mg | ORAL_TABLET | Freq: Two times a day (BID) | ORAL | 3 refills | Status: DC
  Filled 2022-06-16 – 2022-06-29 (×2): qty 60, 30d supply, fill #0
  Filled 2022-07-25: qty 60, 30d supply, fill #1

## 2022-06-16 NOTE — Progress Notes (Signed)
Patient ID: Hayley Garcia, female   DOB: 1975/06/09, 47 y.o.   MRN: [id number]   Hayley Garcia, is a 48 y.o. female  [id number]:[id number]  [id number]  DOB - 05-26-1976  Chief Complaint  Patient presents with   Medication Refill       Subjective:   Hayley Garcia is a 47 y.o. female here today for med RF and poor appetite.  She is currently being seen by GI with upper EGD scheduled for next month.  She wants to get tested for diabetes.  Last blood sugar was 107 in December.  No problems updated.  ALLERGIES: No Known Allergies  PAST MEDICAL HISTORY: Past Medical History:  Diagnosis Date   Anemia    hx of   Anxiety    not on meds at this time   Back pain    Chronic headaches    Continuous urine leakage    Cough    Excessive thirst    GERD (gastroesophageal reflux disease)    not on meds as of 06/17/2021   Hepatic steatosis    Menstrual pain    Night sweats    Pre-diabetes    Shortness of breath    Sore throat    Umbilical hernia    Vision changes     MEDICATIONS AT HOME: Prior to Admission medications   Medication Sig Start Date End Date Taking? Authorizing Provider  albuterol (VENTOLIN HFA) 108 (90 Base) MCG/ACT inhaler Inhale 2 puffs into the lungs every 6 (six) hours as needed for wheezing or shortness of breath. 01/13/22  Yes Charlott Rakes, MD  amitriptyline (ELAVIL) 10 MG tablet Take 1 tablet (10 mg total) by mouth at bedtime. 06/09/22  Yes Daryel November, MD  cetirizine (ZYRTEC) 10 MG tablet Take 1 tablet (10 mg total) by mouth daily. 01/13/22   Charlott Rakes, MD  famotidine (PEPCID) 20 MG tablet Take 1 tablet (20 mg total) by mouth 2 (two) times daily. 06/16/22 07/16/22  Argentina Donovan, PA-C  FLUoxetine (PROZAC) 20 MG capsule Take 1 capsule (20 mg total) by mouth daily. Patient not taking: Reported on 06/09/2022 03/03/22   Charlott Rakes, MD  fluticasone (FLONASE) 50 MCG/ACT nasal spray Place 2 sprays into both nostrils  daily. Patient not taking: Reported on 06/09/2022 01/13/22   Charlott Rakes, MD    ROS: Neg HEENT Neg resp Neg cardiac Neg GU Neg MS Neg psych Neg neuro  Objective:   Vitals:   06/16/22 1101  BP: 109/73  Pulse: 81  SpO2: 93%  Weight: 116 lb 12.8 oz (53 kg)   Exam General appearance : Awake, alert, not in any distress. Speech Clear. Not toxic looking HEENT: Atraumatic and Normocephalic Neck: Supple, no JVD. No cervical lymphadenopathy.  Chest: Good air entry bilaterally, CTAB.  No rales/rhonchi/wheezing CVS: S1 S2 regular, no murmurs.  Extremities: B/L Lower Ext shows no edema, both legs are warm to touch Neurology: Awake alert, and oriented X 3, CN II-XII intact, Non focal Skin: No Rash  Data Review Lab Results  Component Value Date   HGBA1C 5.9 (H) 08/12/2020    Assessment & Plan   1. Language barrier [employer] "Alex" interpreters used and additional time performing visit was required.   2. Gastroesophageal reflux disease without esophagitis Followed by GI - famotidine (PEPCID) 20 MG tablet; Take 1 tablet (20 mg total) by mouth 2 (two) times daily.  Dispense: 60 tablet; Refill: 3  3. Elevated blood sugar I have had a lengthy discussion and provided education  about insulin resistance and the intake of too much sugar/refined carbohydrates.  I have advised the patient to work at a goal of eliminating sugary drinks, candy, desserts, sweets, refined sugars, processed foods, and white carbohydrates.  The patient expresses understanding.   - Hemoglobin A1c  4. Poor appetite Likely due to other GI issues.  Labs including LFTs, lipase, CBC, elctrolytes WNL < 1 month ago.   - TSH    Return in about 6 months (around 12/15/2022) for PCP for chronic conditions.  The patient was given clear instructions to go to ER or return to medical center if symptoms don't improve, worsen or new problems develop. The patient verbalized understanding. The patient was told to call to get lab  results if they haven't heard anything in the next week.      Freeman Caldron, PA-C [facility] [place], [place]   06/16/2022, 12:14 PM

## 2022-06-17 LAB — TSH: TSH: 0.955 u[IU]/mL (ref 0.450–4.500)

## 2022-06-17 LAB — HEMOGLOBIN A1C
Est. average glucose Bld gHb Est-mCnc: 126 mg/dL
Hgb A1c MFr Bld: 6 % — ABNORMAL HIGH (ref 4.8–5.6)

## 2022-06-29 ENCOUNTER — Other Ambulatory Visit: Payer: Self-pay

## 2022-07-07 ENCOUNTER — Other Ambulatory Visit: Payer: Self-pay

## 2022-07-07 ENCOUNTER — Other Ambulatory Visit: Payer: Self-pay | Admitting: Family Medicine

## 2022-07-07 NOTE — Telephone Encounter (Signed)
Unable to refill per protocol, Rx expired. Discontinued 06/16/22.  Requested Prescriptions  Pending Prescriptions Disp Refills   sucralfate (CARAFATE) 1 GM/10ML suspension 420 mL 0    Sig: Take 10 mLs (1 g total) by mouth 4 (four) times daily -  with meals and at bedtime.     Gastroenterology: Antiacids Passed - 07/07/2022 10:59 AM      Passed - Valid encounter within last 12 months    Recent Outpatient Visits           3 weeks ago Language barrier   [place], [place]   4 months ago Screening for metabolic disorder   [place], [place]   5 months ago Post-nasal drip   [place], [place]   7 months ago Annual physical exam   [place], [place]   11 months ago Dyspepsia   [place], Dionne Bucy, PA-C       Future Appointments             In 2 months Charlott Rakes, MD [place]   In 5 months Charlott Rakes, MD [place]

## 2022-07-11 ENCOUNTER — Other Ambulatory Visit: Payer: Self-pay

## 2022-07-25 ENCOUNTER — Other Ambulatory Visit: Payer: Self-pay

## 2022-07-25 ENCOUNTER — Other Ambulatory Visit: Payer: Self-pay | Admitting: Family Medicine

## 2022-07-28 ENCOUNTER — Ambulatory Visit (AMBULATORY_SURGERY_CENTER): Payer: Self-pay | Admitting: Gastroenterology

## 2022-07-28 ENCOUNTER — Encounter: Payer: Self-pay | Admitting: Gastroenterology

## 2022-07-28 VITALS — BP 102/58 | HR 72 | Temp 98.4°F | Resp 15 | Ht 59.0 in | Wt 113.0 lb

## 2022-07-28 DIAGNOSIS — K920 Hematemesis: Secondary | ICD-10-CM

## 2022-07-28 DIAGNOSIS — G8929 Other chronic pain: Secondary | ICD-10-CM

## 2022-07-28 DIAGNOSIS — R1013 Epigastric pain: Secondary | ICD-10-CM

## 2022-07-28 DIAGNOSIS — K2951 Unspecified chronic gastritis with bleeding: Secondary | ICD-10-CM

## 2022-07-28 MED ORDER — SODIUM CHLORIDE 0.9 % IV SOLN: 500.0000 mL | INTRAVENOUS | Status: DC

## 2022-07-28 NOTE — Patient Instructions (Addendum)
Resume previous diet. Continue present medications. Await pathology results. Continue Elavil qhs. Ok to stop pantoprazole  YOU HAD AN ENDOSCOPIC PROCEDURE TODAY AT THE [facility]:   Refer to the procedure report that was given to you for any specific questions about what was found during the examination.  If the procedure report does not answer your questions, please call your gastroenterologist to clarify.  If you requested that your care partner not be given the details of your procedure findings, then the procedure report has been included in a sealed envelope for you to review at your convenience later.  YOU SHOULD EXPECT: Some feelings of bloating in the abdomen. Passage of more gas than usual.  Walking can help get rid of the air that was put into your GI tract during the procedure and reduce the bloating. If you had a lower endoscopy (such as a colonoscopy or flexible sigmoidoscopy) you may notice spotting of blood in your stool or on the toilet paper. If you underwent a bowel prep for your procedure, you may not have a normal bowel movement for a few days.  Please Note:  You might notice some irritation and congestion in your nose or some drainage.  This is from the oxygen used during your procedure.  There is no need for concern and it should clear up in a day or so.  SYMPTOMS TO REPORT IMMEDIATELY:   Following upper endoscopy (EGD)  Vomiting of blood or coffee ground material  New chest pain or pain under the shoulder blades  Painful or persistently difficult swallowing  New shortness of breath  Fever of 100F or higher  Black, tarry-looking stools  For urgent or emergent issues, a gastroenterologist can be reached at any hour by calling [phone]. Do not use MyChart messaging for urgent concerns.    DIET:  We do recommend a small meal at first, but then you may proceed to your regular diet.  Drink plenty of fluids but you should avoid alcoholic beverages for  24 hours.  ACTIVITY:  You should plan to take it easy for the rest of today and you should NOT DRIVE or use heavy machinery until tomorrow (because of the sedation medicines used during the test).    FOLLOW UP: Our staff will call the number listed on your records the next business day following your procedure.  We will call around 7:15- 8:00 am to check on you and address any questions or concerns that you may have regarding the information given to you following your procedure. If we do not reach you, we will leave a message.     If any biopsies were taken you will be contacted by phone or by letter within the next 1-3 weeks.  Please call us at [phone] if you have not heard about the biopsies in 3 weeks.    SIGNATURES/CONFIDENTIALITY: You and/or your care partner have signed paperwork which will be entered into your electronic medical record.  These signatures attest to the fact that that the information above on your After Visit Summary has been reviewed and is understood.  Full responsibility of the confidentiality of this discharge information lies with you and/or your care-partner.

## 2022-07-28 NOTE — Progress Notes (Signed)
[facility] History and Physical   Primary Care Physician:  Charlott Rakes, MD   Reason for Procedure:   Hematemesis, epigastric pain  Plan:    EGD     HPI: Hayley Garcia is a 47 y.o. female undergoing EGD to evaluate for recent history of hematemesis.  She also has chronic abdominal pain which has had a negative work up thus far to include EGD, colonoscopy, [place] and CT.  She was started on a TCA last month with mild improvement in her pain.    Past Medical History:  Diagnosis Date   Anemia    hx of   Anxiety    not on meds at this time   Back pain    Chronic headaches    Continuous urine leakage    Cough    Excessive thirst    GERD (gastroesophageal reflux disease)    not on meds as of 06/17/2021   Hepatic steatosis    Menstrual pain    Night sweats    Pre-diabetes    Shortness of breath    Sore throat    Umbilical hernia    Vision changes     Past Surgical History:  Procedure Laterality Date   ANKLE SURGERY Left    pin    Prior to Admission medications   Medication Sig Start Date End Date Taking? Authorizing Provider  amitriptyline (ELAVIL) 10 MG tablet Take 1 tablet (10 mg total) by mouth at bedtime. 06/09/22  Yes Daryel November, MD  pantoprazole (PROTONIX) 20 MG tablet Take 20 mg by mouth daily.   Yes [provider]  albuterol (VENTOLIN HFA) 108 (90 Base) MCG/ACT inhaler Inhale 2 puffs into the lungs every 6 (six) hours as needed for wheezing or shortness of breath. 01/13/22   Charlott Rakes, MD  cetirizine (ZYRTEC) 10 MG tablet Take 1 tablet (10 mg total) by mouth daily. Patient not taking: Reported on 07/28/2022 01/13/22   Charlott Rakes, MD  famotidine (PEPCID) 20 MG tablet Take 1 tablet (20 mg total) by mouth 2 (two) times daily. Patient not taking: Reported on 07/28/2022 06/16/22 08/28/22  Argentina Donovan, PA-C  FLUoxetine (PROZAC) 20 MG capsule Take 1 capsule (20 mg total) by mouth daily. Patient not taking: Reported  on 06/09/2022 03/03/22   Charlott Rakes, MD  fluticasone (FLONASE) 50 MCG/ACT nasal spray Place 2 sprays into both nostrils daily. Patient not taking: Reported on 06/09/2022 01/13/22   Charlott Rakes, MD    Current Outpatient Medications  Medication Sig Dispense Refill   amitriptyline (ELAVIL) 10 MG tablet Take 1 tablet (10 mg total) by mouth at bedtime. 90 tablet 3   pantoprazole (PROTONIX) 20 MG tablet Take 20 mg by mouth daily.     albuterol (VENTOLIN HFA) 108 (90 Base) MCG/ACT inhaler Inhale 2 puffs into the lungs every 6 (six) hours as needed for wheezing or shortness of breath. 18 g 2   cetirizine (ZYRTEC) 10 MG tablet Take 1 tablet (10 mg total) by mouth daily. (Patient not taking: Reported on 07/28/2022) 30 tablet 1   famotidine (PEPCID) 20 MG tablet Take 1 tablet (20 mg total) by mouth 2 (two) times daily. (Patient not taking: Reported on 07/28/2022) 60 tablet 3   FLUoxetine (PROZAC) 20 MG capsule Take 1 capsule (20 mg total) by mouth daily. (Patient not taking: Reported on 06/09/2022) 30 capsule 3   fluticasone (FLONASE) 50 MCG/ACT nasal spray Place 2 sprays into both nostrils daily. (Patient not taking: Reported on 06/09/2022) 16 g  1   Current Facility-Administered Medications  Medication Dose Route Frequency Provider Last Rate Last Admin   0.9 %  sodium chloride infusion  500 mL Intravenous Continuous Daryel November, MD        Allergies as of 07/28/2022   (No Known Allergies)    Family History  Problem Relation Age of Onset   Colon polyps Neg Hx    Colon cancer Neg Hx    Esophageal cancer Neg Hx    Rectal cancer Neg Hx    Stomach cancer Neg Hx     Social History   Socioeconomic History   Marital status: Single    Spouse name: Not on file   Number of children: 2   Years of education: Not on file   Highest education level: Not on file  Occupational History   Occupation: Chartered certified accountant  Tobacco Use   Smoking status: Never   Smokeless tobacco: Never  Vaping Use    Vaping Use: Never used  Substance and Sexual Activity   Alcohol use: Not Currently   Drug use: Never   Sexual activity: Not Currently  Other Topics Concern   Not on file  Social History Narrative   Not on file   Social Determinants of Health   Financial Resource Strain: Not on file  Food Insecurity: Not on file  Transportation Needs: Not on file  Physical Activity: Not on file  Stress: Not on file  Social Connections: Not on file  Intimate Partner Violence: Not on file    Review of Systems:  All other review of systems negative except as mentioned in the HPI.  Physical Exam: Vital signs BP 104/76   Pulse 82   Temp 98.4 F (36.9 C)   Ht '4\' 11"'$  (1.499 m)   Wt 113 lb (51.3 kg)   LMP 07/09/2022   SpO2 99%   BMI 22.82 kg/m   General:   Alert,  Well-developed, well-nourished, pleasant and cooperative in NAD Airway:  Mallampati 2 Lungs:  Clear throughout to auscultation.   Heart:  Regular rate and rhythm; no murmurs, clicks, rubs,  or gallops. Abdomen:  Soft, nontender and nondistended. Normal bowel sounds.   Neuro/Psych:  Normal mood and affect. A and O x 3   Hayley Garcia E. Candis Schatz, MD [facility]

## 2022-07-28 NOTE — Op Note (Signed)
[facility] Patient Name: Hayley Garcia Procedure Date: 07/28/2022 10:11 AM MRN: [id number] Endoscopist: Nicki Reaper E. Candis Schatz , MD, [id number] Age: 47 Referring MD:  Date of Birth: [birth year] Gender: Female Account #: [id number] Procedure:                Upper GI endoscopy Indications:              Epigastric abdominal pain, Hematemesis Medicines:                Monitored Anesthesia Care Procedure:                Pre-Anesthesia Assessment:                           - Prior to the procedure, a History and Physical                            was performed, and patient medications and                            allergies were reviewed. The patient's tolerance of                            previous anesthesia was also reviewed. The risks                            and benefits of the procedure and the sedation                            options and risks were discussed with the patient.                            All questions were answered, and informed consent                            was obtained. Prior Anticoagulants: The patient has                            taken no anticoagulant or antiplatelet agents. ASA                            Grade Assessment: II - A patient with mild systemic                            disease. After reviewing the risks and benefits,                            the patient was deemed in satisfactory condition to                            undergo the procedure.                           After obtaining informed consent, the endoscope was  passed under direct vision. Throughout the                            procedure, the patient's blood pressure, pulse, and                            oxygen saturations were monitored continuously. The                            Olympus Scope [id number] was introduced through the                            mouth, and advanced to the second part of duodenum.                             The upper GI endoscopy was accomplished without                            difficulty. The patient tolerated the procedure                            well. Scope In: Scope Out: Findings:                 The examined portions of the nasopharynx,                            oropharynx and larynx were normal.                           The examined esophagus was normal.                           The entire examined stomach was normal. Biopsies                            were taken with a cold forceps for Helicobacter                            pylori testing. Estimated blood loss was minimal.                           The examined duodenum was normal. Complications:            No immediate complications. Estimated Blood Loss:     Estimated blood loss was minimal. Impression:               - The examined portions of the nasopharynx,                            oropharynx and larynx were normal.                           - Normal esophagus.                           - Normal  stomach. Biopsied.                           - Normal examined duodenum.                           - No abnormalities to explain patient's                            hematemesis. Suspect pharyngeal source vs Mallory                            Weiss tear which has subsequently healed. Recommendation:           - Patient has a contact number available for                            emergencies. The signs and symptoms of potential                            delayed complications were discussed with the                            patient. Return to normal activities tomorrow.                            Written discharge instructions were provided to the                            patient.                           - Resume previous diet.                           - Continue present medications.                           - Await pathology results.                           - Continue Elavil qhs.                           -  Ok to stop pantoprazole [name] E. Candis Schatz, MD 07/28/2022 10:30:34 AM This report has been signed electronically.

## 2022-07-28 NOTE — Progress Notes (Signed)
Called to room to assist during endoscopic procedure.  Patient ID and intended procedure confirmed with present staff. Received instructions for my participation in the procedure from the performing physician.  

## 2022-07-28 NOTE — Progress Notes (Signed)
Vss nad trans to pacu °

## 2022-07-29 ENCOUNTER — Telehealth: Payer: Self-pay

## 2022-07-29 NOTE — Telephone Encounter (Signed)
  Follow up Call-     07/28/2022    9:30 AM 07/01/2021   10:13 AM  Call back number  Post procedure Call Back phone  # [phone]  Permission to leave phone message Yes Yes     Patient questions:  Do you have a fever, pain , or abdominal swelling? No. Pain Score  0 *  Have you tolerated food without any problems? Yes.    Have you been able to return to your normal activities? Yes.    Do you have any questions about your discharge instructions: Diet   No. Medications  No. Follow up visit  No.  Do you have questions or concerns about your Care? No.  Actions: * If pain score is 4 or above: No action needed, pain <4.

## 2022-08-04 ENCOUNTER — Encounter: Payer: Self-pay | Admitting: Gastroenterology

## 2022-08-12 ENCOUNTER — Other Ambulatory Visit: Payer: Self-pay

## 2022-08-19 ENCOUNTER — Telehealth: Payer: Self-pay | Admitting: Gastroenterology

## 2022-08-19 NOTE — Telephone Encounter (Signed)
Called and spoke with patient. We reviewed pathology results and recommendations in spanish. Pt will increase Elavil to 20 mg at bedtime for abdominal pain. Pt verbalized understanding and had no concerns at the end of the call.

## 2022-08-19 NOTE — Telephone Encounter (Signed)
Inbound call from patient, is requesting to speak to a nurse in regards to pathology results. Patient states she was sent a letter with the results but does not understand as it is in [place]. She would like to go over results with a nurse. She will need an interpreter. Please advise.

## 2022-09-02 ENCOUNTER — Other Ambulatory Visit: Payer: Self-pay

## 2022-09-20 ENCOUNTER — Ambulatory Visit: Payer: Self-pay | Admitting: *Deleted

## 2022-09-20 NOTE — Telephone Encounter (Signed)
Message from [place] sent at 09/20/2022  1:01 PM EDT  Summary: Pt requesting that the amitriptyline (ELAVIL) 10 MG tablet be increased to 2 tablets daily   Pt stated she was advised by another doctor to request that the Rx for amitriptyline (ELAVIL) 10 MG tablet be increased to 2 tablets daily. Cb# [phone]          Call History   Type Contact Phone/Fax User  09/20/2022 12:58 PM EDT Phone (Incoming) [name], Hayley Garcia (Self) [phone] Adela Glimpse   Reason for Disposition  [1] Caller has URGENT medicine question about med that PCP or specialist prescribed AND [2] triager unable to answer question    GI dr. Has instructed her to increase the Elavil to 20 mg a day from 10 mg after doing the endoscopy.    She is needing a refill with the new dosage.  Answer Assessment - Initial Assessment Questions 1. NAME of MEDICINE: "What medicine(s) are you calling about?"     Elavil 10 mg   Using Spanish Interpreter from [employer] to return her call. 2. QUESTION: "What is your question?" (e.g., double dose of medicine, side effect)     What are the instructions for taking this?    The dr. Claiborne Billings did the endoscopy on my stomach wants me to take 2 tablets instead of 1 tablet of  the Elavil. I'm to stop the Omeprazole.    3. PRESCRIBER: "Who prescribed the medicine?" Reason: if prescribed by specialist, call should be referred to that group.     Dr. Tiajuana Amass from [facility]. Has ordered her to increase her dose to 20 mg or 2 tablets a day.  She is requesting the results of her endoscopy.   I let her know she would need to contact the dr. Claiborne Billings did the endoscopy.   She was agreeable to this.  I let her know I would send a message to Dr. Alvis Lemmings with her request for the increased dose of the Elavil to be refilled and she wants it filled at the Pharmacy there at [facility].   4. SYMPTOMS: "Do you have any symptoms?" If Yes, ask: "What symptoms  are you having?"  "How bad are the symptoms (e.g., mild, moderate, severe)     N/A 5. PREGNANCY:  "Is there any chance that you are pregnant?" "When was your last menstrual period?"     N/A  Protocols used: Medication Question Call-A-AH

## 2022-09-20 NOTE — Telephone Encounter (Signed)
  Chief Complaint: Dr. Tiajuana Amass with [facility] has recommended pt. Increase her Elavil 10 mg to 20 mg every night after doing her endoscopy.   She is needing a refill with the new dose of 20 mg of the Elavil called into the Pharmacy at [facility]. Symptoms: N/A Frequency: N/A Pertinent Negatives: Patient denies N/A Disposition: ED /[] Urgent Care (no appt availability in office) / Appointment(In office/virtual)/  [place] [facility]/ Home Care/ Refused Recommended Disposition /[] [facility]/  Follow-up with PCP Additional Notes: Per the notes from Dr. Tiajuana Amass dated 08/19/2022 he has recommended she increase her Elavil dose from 10 mg to 20 mg nightly. Pt. Also mentioned he told her to stop the omeprazole.  Call completed using [employer] for Spanish [phone].  Message sent to Dr. Alvis Lemmings.

## 2022-09-21 NOTE — Telephone Encounter (Signed)
Please have her contact her GI doctor to send in the new prescription to her pharmacy.  She can also take 2 tablets of her current 10 mg enalapril to equal 20 mg.

## 2022-09-22 ENCOUNTER — Ambulatory Visit: Payer: Self-pay | Admitting: Family Medicine

## 2022-09-23 NOTE — Telephone Encounter (Signed)
LVM for patient to return phone call.  

## 2022-10-04 ENCOUNTER — Other Ambulatory Visit: Payer: Self-pay

## 2022-10-28 ENCOUNTER — Ambulatory Visit: Payer: Self-pay | Admitting: *Deleted

## 2022-10-28 NOTE — Telephone Encounter (Signed)
  Chief Complaint: C/o stomach burning Symptoms: Feels like the same symptoms she had when diagnosed with H. Pylori.   Frequency: Burning for a week now Pertinent Negatives: Patient denies vomiting or diarrhea Disposition: [] ED /[] Urgent Care (no appt availability in office) / [x] Appointment(In office/virtual)/ []  [facility]/ [] Home Care/ [] Refused Recommended Disposition /[] [facility]/ []  Follow-up with PCP Additional Notes: Pt already has an appt with Bertram Denver, NP for 11/22/2022 at 10:30.   No sooner appts.   Pt. Agreeable to talking with Zelda about it at that appt.

## 2022-10-28 NOTE — Telephone Encounter (Signed)
Reason for Disposition  Abdominal pain is a chronic symptom (recurrent or ongoing AND present > 4 weeks)    Same pain as when H. Pylori  Answer Assessment - Initial Assessment Questions 1. LOCATION: "Where does it hurt?"      Called in with Spanish interpreter. I'm having a burning sensation in my stomach.   No diarrhea or vomiting 2. RADIATION: "Does the pain shoot anywhere else?" (e.g., chest, back)     The side of my ribs hurt.   I'm tired too 3. ONSET: "When did the pain begin?" (e.g., minutes, hours or days ago)      A week ago 4. SUDDEN: "Gradual or sudden onset?"     Not asked 5. PATTERN "Does the pain come and go, or is it constant?"    - If it comes and goes: "How long does it last?" "Do you have pain now?"     (Note: Comes and goes means the pain is intermittent. It goes away completely between bouts.)    - If constant: "Is it getting better, staying the same, or getting worse?"      (Note: Constant means the pain never goes away completely; most serious pain is constant and gets worse.)      It comes and goes.   I ate rice last week and felt bloated.   My stomach has bothered me since that time. No I have not tried any OTC medications.  I've had gastritis.    6. SEVERITY: "How bad is the pain?"  (e.g., Scale 1-10; mild, moderate, or severe)    - MILD (1-3): Doesn't interfere with normal activities, abdomen soft and not tender to touch.     - MODERATE (4-7): Interferes with normal activities or awakens from sleep, abdomen tender to touch.     - SEVERE (8-10): Excruciating pain, doubled over, unable to do any normal activities.       Mild 7. RECURRENT SYMPTOM: "Have you ever had this type of stomach pain before?" If Yes, ask: "When was the last time?" and "What happened that time?"      Yes with gastritis  8. CAUSE: "What do you think is causing the stomach pain?"     Similar to when I had H. Pylori      I was treated for that.   The medications gave me gastritis 9.  RELIEVING/AGGRAVATING FACTORS: "What makes it better or worse?" (e.g., antacids, bending or twisting motion, bowel movement)     Not tried anything OTC 10. OTHER SYMPTOMS: "Do you have any other symptoms?" (e.g., back pain, diarrhea, fever, urination pain, vomiting)       No 11. PREGNANCY: "Is there any chance you are pregnant?" "When was your last menstrual period?"       Not asked  Protocols used: Abdominal Pain - [facility]

## 2022-11-08 ENCOUNTER — Other Ambulatory Visit: Payer: Self-pay

## 2022-11-12 ENCOUNTER — Other Ambulatory Visit: Payer: Self-pay

## 2022-11-12 ENCOUNTER — Emergency Department (HOSPITAL_COMMUNITY)
Admission: EM | Admit: 2022-11-12 | Discharge: 2022-11-13 | Disposition: A | Discharge status: Home / Self Care | Payer: Self-pay | Attending: Emergency Medicine | Admitting: Emergency Medicine

## 2022-11-12 ENCOUNTER — Encounter (HOSPITAL_COMMUNITY): Payer: Self-pay | Admitting: Emergency Medicine

## 2022-11-12 DIAGNOSIS — K2950 Unspecified chronic gastritis without bleeding: Secondary | ICD-10-CM | POA: Insufficient documentation

## 2022-11-12 NOTE — ED Triage Notes (Signed)
Spanish interpreter used. Pt in with abdominal pain, bloating x 1 wk. Pain also radiates to low back. States last BM was at noon. Pt also reporting HA, joint pain and nausea.

## 2022-11-13 LAB — CBC
HCT: 43 % (ref 36.0–46.0)
Hemoglobin: 13.4 g/dL (ref 12.0–15.0)
MCH: 27.5 pg (ref 26.0–34.0)
MCHC: 31.2 g/dL (ref 30.0–36.0)
MCV: 88.1 fL (ref 80.0–100.0)
Platelets: 316 10*3/uL (ref 150–400)
RBC: 4.88 MIL/uL (ref 3.87–5.11)
RDW: 14.3 % (ref 11.5–15.5)
WBC: 8.4 10*3/uL (ref 4.0–10.5)
nRBC: 0 % (ref 0.0–0.2)

## 2022-11-13 LAB — URINALYSIS, ROUTINE W REFLEX MICROSCOPIC
Bilirubin Urine: NEGATIVE
Glucose, UA: NEGATIVE mg/dL
Ketones, ur: NEGATIVE mg/dL
Leukocytes,Ua: NEGATIVE
Nitrite: NEGATIVE
Protein, ur: NEGATIVE mg/dL
Specific Gravity, Urine: 1.002 — ABNORMAL LOW (ref 1.005–1.030)
pH: 6 (ref 5.0–8.0)

## 2022-11-13 LAB — COMPREHENSIVE METABOLIC PANEL
ALT: 15 U/L (ref 0–44)
AST: 19 U/L (ref 15–41)
Albumin: 3.9 g/dL (ref 3.5–5.0)
Alkaline Phosphatase: 72 U/L (ref 38–126)
Anion gap: 9 (ref 5–15)
BUN: 11 mg/dL (ref 6–20)
CO2: 23 mmol/L (ref 22–32)
Calcium: 8.6 mg/dL — ABNORMAL LOW (ref 8.9–10.3)
Chloride: 103 mmol/L (ref 98–111)
Creatinine, Ser: 0.58 mg/dL (ref 0.44–1.00)
GFR, Estimated: >60 mL/min (ref >60)
Glucose, Bld: 102 mg/dL — ABNORMAL HIGH (ref 70–99)
Potassium: 3.5 mmol/L (ref 3.5–5.1)
Sodium: 135 mmol/L (ref 135–145)
Total Bilirubin: 0.3 mg/dL (ref 0.3–1.2)
Total Protein: 7.4 g/dL (ref 6.5–8.1)

## 2022-11-13 LAB — LIPASE, BLOOD: Lipase: 32 U/L (ref 11–51)

## 2022-11-13 LAB — I-STAT BETA HCG BLOOD, ED (MC, WL, AP ONLY): I-stat hCG, quantitative: <5 m[IU]/mL (ref <5)

## 2022-11-13 MED ORDER — METOCLOPRAMIDE HCL 10 MG PO TABS
10.0000 mg | ORAL_TABLET | Freq: Four times a day (QID) | ORAL | 0 refills | Status: DC
  Filled 2022-11-13: qty 30, 8d supply, fill #0

## 2022-11-13 MED ORDER — SUCRALFATE 1 GM/10ML PO SUSP
1.0000 g | Freq: Three times a day (TID) | ORAL | 0 refills | Status: DC
  Filled 2022-11-13: qty 420, 11d supply, fill #0

## 2022-11-13 MED ORDER — OXYCODONE-ACETAMINOPHEN 5-325 MG PO TABS
1.0000 | ORAL_TABLET | Freq: Once | ORAL | Status: AC
  Administered 2022-11-13: 1 via ORAL
  Filled 2022-11-13: qty 1

## 2022-11-13 MED ORDER — DICYCLOMINE HCL 10 MG PO CAPS
20.0000 mg | ORAL_CAPSULE | Freq: Once | ORAL | Status: AC
  Administered 2022-11-13: 20 mg via ORAL
  Filled 2022-11-13: qty 2

## 2022-11-13 MED ORDER — FAMOTIDINE 20 MG PO TABS
20.0000 mg | ORAL_TABLET | Freq: Once | ORAL | Status: AC
  Administered 2022-11-13: 20 mg via ORAL
  Filled 2022-11-13: qty 1

## 2022-11-13 NOTE — ED Provider Notes (Signed)
[place] EMERGENCY DEPARTMENT AT [facility] Provider Note   CSN: [id number] Arrival date & time: 11/12/22  2339     History  Chief Complaint  Patient presents with   Abdominal Pain    Hayley Garcia is a 47 y.o. female.   Abdominal Pain  Spanish-language interpreter was used for the entirety of visit.  Patient is a 47 year old female with past medical history significant for chronic gastritis, chronic abdominal pain  She states that she has very frequent episodes of abdominal pain that have been ongoing for over a year.  She states that the pain present in multiple different ways but has been sharp severe pain for the past 3 days.  She states she has had some nausea but no vomiting.  She states that she has been having normal stool with no blood in her poop.  She states that she has had some abdominal bloating over the past week.  She states that she has daily abdominal pain not infrequently.  She came to the ER today because of continued pain.  Had EGD feb 2023 - chronic gastritis with reactive gastropathy on biopsy    Home Medications Prior to Admission medications   Medication Sig Start Date End Date Taking? Authorizing Provider  metoCLOPramide (REGLAN) 10 MG tablet Take 1 tablet (10 mg total) by mouth every 6 (six) hours. 11/13/22  Yes Brekyn Huntoon S, PA  sucralfate (CARAFATE) 1 GM/10ML suspension Take 10 mLs (1 g total) by mouth 4 (four) times daily -  with meals and at bedtime. 11/13/22  Yes Trenee Igoe S, PA  albuterol (VENTOLIN HFA) 108 (90 Base) MCG/ACT inhaler Inhale 2 puffs into the lungs every 6 (six) hours as needed for wheezing or shortness of breath. 01/13/22   Hoy Register, MD  amitriptyline (ELAVIL) 10 MG tablet Take 1 tablet (10 mg total) by mouth at bedtime. 06/09/22   Jenel Lucks, MD  cetirizine (ZYRTEC) 10 MG tablet Take 1 tablet (10 mg total) by mouth daily. Patient not taking: Reported on 07/28/2022 01/13/22   Hoy Register, MD  famotidine (PEPCID) 20 MG tablet Take 1 tablet (20 mg total) by mouth 2 (two) times daily. Patient not taking: Reported on 07/28/2022 06/16/22 08/28/22  Anders Simmonds, PA-C  FLUoxetine (PROZAC) 20 MG capsule Take 1 capsule (20 mg total) by mouth daily. Patient not taking: Reported on 06/09/2022 03/03/22   Hoy Register, MD  fluticasone (FLONASE) 50 MCG/ACT nasal spray Place 2 sprays into both nostrils daily. Patient not taking: Reported on 06/09/2022 01/13/22   Hoy Register, MD  pantoprazole (PROTONIX) 20 MG tablet Take 20 mg by mouth daily.    [provider]      Allergies    Patient has no known allergies.    Review of Systems   Review of Systems  Gastrointestinal:  Positive for abdominal pain.    Physical Exam Updated Vital Signs BP (!) 136/93   Pulse 86   Temp 97.9 F (36.6 C) (Oral)   Resp 16   Wt 51.3 kg   LMP 11/07/2022   SpO2 100%   BMI 22.84 kg/m  Physical Exam Vitals and nursing note reviewed.  Constitutional:      General: She is not in acute distress. HENT:     Head: Normocephalic and atraumatic.     Nose: Nose normal.     Mouth/Throat:     Mouth: Mucous membranes are moist.  Eyes:     General: No scleral icterus. Cardiovascular:  Rate and Rhythm: Normal rate and regular rhythm.     Pulses: Normal pulses.     Heart sounds: Normal heart sounds.  Pulmonary:     Effort: Pulmonary effort is normal. No respiratory distress.     Breath sounds: No wheezing.  Abdominal:     Palpations: Abdomen is soft.     Tenderness: There is abdominal tenderness. There is no guarding or rebound.     Comments: Generalized abdominal tenderness.  No guarding or rebound.  No focal abdominal tenderness  Musculoskeletal:     Cervical back: Normal range of motion.     Right lower leg: No edema.     Left lower leg: No edema.  Skin:    General: Skin is warm and dry.     Capillary Refill: Capillary refill takes less than 2 seconds.  Neurological:      Mental Status: She is alert. Mental status is at baseline.  Psychiatric:        Mood and Affect: Mood normal.        Behavior: Behavior normal.     ED Results / Procedures / Treatments   Labs (all labs ordered are listed, but only abnormal results are displayed) Labs Reviewed  COMPREHENSIVE METABOLIC PANEL - Abnormal; Notable for the following components:      Result Value   Glucose, Bld 102 (*)    Calcium 8.6 (*)    All other components within normal limits  URINALYSIS, ROUTINE W REFLEX MICROSCOPIC - Abnormal; Notable for the following components:   Color, Urine COLORLESS (*)    Specific Gravity, Urine 1.002 (*)    Hgb urine dipstick SMALL (*)    Bacteria, UA RARE (*)    All other components within normal limits  LIPASE, BLOOD  CBC  I-STAT BETA HCG BLOOD, ED ([facility], [facility], [facility] ONLY)    EKG None  Radiology No results found.  Procedures Procedures    Medications Ordered in ED Medications  dicyclomine (BENTYL) capsule 20 mg (20 mg Oral Given 11/13/22 0255)  famotidine (PEPCID) tablet 20 mg (20 mg Oral Given 11/13/22 0256)  oxyCODONE-acetaminophen (PERCOCET/ROXICET) 5-325 MG per tablet 1 tablet (1 tablet Oral Given 11/13/22 0255)    ED Course/ Medical Decision Making/ A&P                             Medical Decision Making Amount and/or Complexity of Data Reviewed Labs: ordered.  Risk Prescription drug management.    This patient presents to the ED for concern of abd pain, this involves a number of treatment options, and is a complaint that carries with it a moderate risk of complications and morbidity. A differential diagnosis was considered for the patient's symptoms which is discussed below:   The causes of generalized abdominal pain include but are not limited to AAA, mesenteric ischemia, appendicitis, diverticulitis, DKA, gastritis, gastroenteritis, AMI, nephrolithiasis, pancreatitis, peritonitis, adrenal insufficiency,lead poisoning, iron toxicity, intestinal  ischemia, constipation, UTI,SBO/LBO, splenic rupture, biliary disease, IBD, IBS, PUD, or hepatitis. Ectopic pregnancy, ovarian torsion, PID.    Co morbidities: Discussed in HPI   Brief History:  Spanish-language interpreter was used for the entirety of visit.  Patient is a 47 year old female with past medical history significant for chronic gastritis, chronic abdominal pain  She states that she has very frequent episodes of abdominal pain that have been ongoing for over a year.  She states that the pain present in multiple different ways but has been  sharp severe pain for the past 3 days.  She states she has had some nausea but no vomiting.  She states that she has been having normal stool with no blood in her poop.  She states that she has had some abdominal bloating over the past week.  She states that she has daily abdominal pain not infrequently.  She came to the ER today because of continued pain.  Had EGD feb 2023 - chronic gastritis with reactive gastropathy on biopsy    EMR reviewed including pt PMHx, past surgical history and past visits to ER.   See HPI for more details   Lab Tests:   I personally reviewed all laboratory work and imaging. Metabolic panel without any acute abnormality specifically kidney function within normal limits and no significant electrolyte abnormalities. CBC without leukocytosis or significant anemia. Lipase within normal limits acid hCG pregnancy, urinalysis inconsistent with UTI.  Imaging Studies:  No imaging studies ordered for this patient  Reviewed last CT abdomen pelvis with contrast that was obtained in December 2023.  This was unremarkable  Cardiac Monitoring:  NA NA   Medicines ordered:  I ordered medication including Bentyl, Percocet, Pepcid for pain Reevaluation of the patient after these medicines showed that the patient improved I have reviewed the patients home medicines and have made adjustments as needed   Critical  Interventions:     Consults/Attending Physician      Reevaluation:  After the interventions noted above I re-evaluated patient and found that they have :improved   Social Determinants of Health:  The patient's social determinants of health were a factor in the care of this patient    Problem List / ED Course:  Patient with chronic abdominal pain.  No abdominal tenderness on repeat abdominal exam although she was diffusely mildly tender with deep palpation earlier.  Seems to be feeling much improved after p.o. medications here.  She is tolerating p.o. ambulatory.  Will discharge home.  Spanish-language interpreter was used for the entirety of visit.  Recommend follow-up with her gastroenterologist at [facility].   Dispostion:  After consideration of the diagnostic results and the patients response to treatment, I feel that the patent would benefit from discharge with outpatient follow-up.   Final Clinical Impression(s) / ED Diagnoses Final diagnoses:  Chronic gastritis, presence of bleeding unspecified, unspecified gastritis type    Rx / DC Orders ED Discharge Orders          Ordered    sucralfate (CARAFATE) 1 GM/10ML suspension  3 times daily with meals & bedtime        11/13/22 0247    metoCLOPramide (REGLAN) 10 MG tablet  Every 6 hours        11/13/22 0522              Gailen Shelter, PA 11/13/22 1610    Zadie Rhine, MD 11/13/22 [phone]

## 2022-11-13 NOTE — Discharge Instructions (Addendum)
I would like for you to follow-up with your gastroenterologist if you do not have a gastroenterologist that you are already following with I would like you to follow-up with the 1 that I have given you information for.  I would like you to use the Carafate that I have prescribed you 4 times daily once before each meal and once at bedtime.  Make sure that you continue to take the Protonix.   Me gustara que hiciera un seguimiento con su gastroenterlogo, si no tiene [employer] con el que ya est siguiendo, me gustara que hiciera un seguimiento con el 1 para el que le he dado informacin.  Me gustara que usaras el Carafate que te he recetado 4 veces al da, una antes de cada comida y Neomia Dear vez antes de acostarte.  Asegrese de seguir [employer].

## 2022-11-14 ENCOUNTER — Other Ambulatory Visit: Payer: Self-pay

## 2022-11-15 ENCOUNTER — Other Ambulatory Visit: Payer: Self-pay

## 2022-11-15 ENCOUNTER — Ambulatory Visit: Payer: Self-pay | Admitting: *Deleted

## 2022-11-15 NOTE — Telephone Encounter (Signed)
Interpreter [place] ID # [id number] Chief Complaint: medication questions and clarification how to take reglan and carafate Symptoms: na  Frequency: na Pertinent Negatives: Patient denies na Disposition: [] ED /[] Urgent Care (no appt availability in office) / [] Appointment(In office/virtual)/ []  [facility]/ [x] Home Care/ [] Refused Recommended Disposition /[] [place] [facility]/ []  Follow-up with PCP Additional Notes:   Recommended patient review with pharmacist . NT transferred patient and interpreter to [facility] pharmacy and pharmacist was able to answer patient questions regarding when to take both medications and how to take safely.    Reason for Disposition  [1] Caller has medicine question about med NOT prescribed by PCP AND [2] triager unable to answer question (e.g., compatibility with other med, storage)  Answer Assessment - Initial Assessment Questions 1. NAME of MEDICINE: "What medicine(s) are you calling about?"     Reglan and carafate 2. QUESTION: "What is your question?" (e.g., double dose of medicine, side effect)     How to take the medications ? Can take together? 3. PRESCRIBER: "Who prescribed the medicine?" Reason: if prescribed by specialist, call should be referred to that group.     Prescribed by Annamaria Boots, PA 4. SYMPTOMS: "Do you have any symptoms?" If Yes, ask: "What symptoms are you having?"  "How bad are the symptoms (e.g., mild, moderate, severe)     na 5. PREGNANCY:  "Is there any chance that you are pregnant?" "When was your last menstrual period?"     na  Protocols used: Medication Question Call-A-AH

## 2022-11-15 NOTE — Telephone Encounter (Signed)
Summary: med management   Pt is asking how she should take medication Metoclopramide is(REGLAN) 10 MG tablet.  Please return pt call with Spanish Interpreter.  Seeking clinical advice.          Called patient via interpreter [place] ID # [id number] on [phone] to review medication questions. No answer, LVMTCB [phone].

## 2022-11-15 NOTE — Telephone Encounter (Signed)
2nd attempt to contact patient [phone] via interpreter ID # [phone] to review medication questions. No answer, LVMTCB [phone].

## 2022-11-22 ENCOUNTER — Encounter: Payer: Self-pay | Admitting: Nurse Practitioner

## 2022-11-22 ENCOUNTER — Ambulatory Visit: Payer: Self-pay | Attending: Family Medicine | Admitting: Nurse Practitioner

## 2022-11-22 ENCOUNTER — Other Ambulatory Visit: Payer: Self-pay

## 2022-11-22 VITALS — BP 113/74 | HR 86 | Ht 59.0 in | Wt 121.2 lb

## 2022-11-22 DIAGNOSIS — J984 Other disorders of lung: Secondary | ICD-10-CM

## 2022-11-22 MED ORDER — ALBUTEROL SULFATE HFA 108 (90 BASE) MCG/ACT IN AERS
2.0000 | INHALATION_SPRAY | Freq: Four times a day (QID) | RESPIRATORY_TRACT | 2 refills | Status: DC | PRN
Start: 2022-11-22 — End: 2024-03-05
  Filled 2022-11-22: qty 6.7, 25d supply, fill #0

## 2022-11-22 NOTE — Progress Notes (Signed)
Assessment & Plan:  Hayley Garcia was seen today for shortness of breath.  Diagnoses and all orders for this visit:  Restrictive airway disease -     albuterol (VENTOLIN HFA) 108 (90 Base) MCG/ACT inhaler; Inhale 2 puffs into the lungs every 6 (six) hours as needed for wheezing or shortness of breath. -     DG Chest 2 View; Future    Patient has been counseled on age-appropriate routine health concerns for screening and prevention. These are reviewed and up-to-date. Referrals have been placed accordingly. Immunizations are up-to-date or declined.    Subjective:   Chief Complaint  Patient presents with   Shortness of Breath   Shortness of Breath Pertinent negatives include no chest pain, fever, headaches, hemoptysis, leg swelling, sputum production, vomiting or wheezing.   Hayley Garcia 47 y.o. female presents to office today with complaints of fatigue and shortness of breath with difficulty breathing for the past several months. Denies productive cough, chest pain and reports adherence with taking PPI and sucralfate. She does not have a history of asthma and does not smoke.     Review of Systems  Constitutional:  Negative for fever, malaise/fatigue and weight loss.  HENT: Negative.  Negative for nosebleeds.   Eyes: Negative.  Negative for blurred vision, double vision and photophobia.  Respiratory:  Positive for shortness of breath. Negative for cough, hemoptysis, sputum production and wheezing.   Cardiovascular: Negative.  Negative for chest pain, palpitations and leg swelling.  Gastrointestinal: Negative.  Negative for heartburn, nausea and vomiting.  Musculoskeletal: Negative.  Negative for myalgias.  Neurological: Negative.  Negative for dizziness, focal weakness, seizures and headaches.  Psychiatric/Behavioral: Negative.  Negative for suicidal ideas.     Past Medical History:  Diagnosis Date   Anemia    hx of   Anxiety    not on meds at this time   Back pain     Chronic headaches    Continuous urine leakage    Cough    Excessive thirst    GERD (gastroesophageal reflux disease)    not on meds as of 06/17/2021   Hepatic steatosis    Menstrual pain    Night sweats    Pre-diabetes    Shortness of breath    Sore throat    Umbilical hernia    Vision changes     Past Surgical History:  Procedure Laterality Date   ANKLE SURGERY Left    pin    Family History  Problem Relation Age of Onset   Colon polyps Neg Hx    Colon cancer Neg Hx    Esophageal cancer Neg Hx    Rectal cancer Neg Hx    Stomach cancer Neg Hx     Social History Reviewed with no changes to be made today.   Outpatient Medications Prior to Visit  Medication Sig Dispense Refill   amitriptyline (ELAVIL) 10 MG tablet Take 1 tablet (10 mg total) by mouth at bedtime. 90 tablet 3   metoCLOPramide (REGLAN) 10 MG tablet Take 1 tablet (10 mg total) by mouth every 6 (six) hours. 30 tablet 0   pantoprazole (PROTONIX) 20 MG tablet Take 20 mg by mouth daily.     sucralfate (CARAFATE) 1 GM/10ML suspension Take 10 mLs (1 g total) by mouth 4 (four) times daily -  with meals and at bedtime. 420 mL 0   FLUoxetine (PROZAC) 20 MG capsule Take 1 capsule (20 mg total) by mouth daily. (Patient not taking: Reported on 06/09/2022)  30 capsule 3   albuterol (VENTOLIN HFA) 108 (90 Base) MCG/ACT inhaler Inhale 2 puffs into the lungs every 6 (six) hours as needed for wheezing or shortness of breath. (Patient not taking: Reported on 11/22/2022) 18 g 2   cetirizine (ZYRTEC) 10 MG tablet Take 1 tablet (10 mg total) by mouth daily. (Patient not taking: Reported on 07/28/2022) 30 tablet 1   famotidine (PEPCID) 20 MG tablet Take 1 tablet (20 mg total) by mouth 2 (two) times daily. (Patient not taking: Reported on 07/28/2022) 60 tablet 3   fluticasone (FLONASE) 50 MCG/ACT nasal spray Place 2 sprays into both nostrils daily. (Patient not taking: Reported on 06/09/2022) 16 g 1   No facility-administered medications  prior to visit.    No Known Allergies     Objective:    BP 113/74 (BP Location: Left Arm, Patient Position: Sitting, Cuff Size: Normal)   Pulse 86   Ht 4\' 11"  (1.499 m)   Wt 121 lb 3.2 oz (55 kg)   LMP 11/07/2022 (Approximate)   SpO2 100%   BMI 24.48 kg/m  Wt Readings from Last 3 Encounters:  11/22/22 121 lb 3.2 oz (55 kg)  11/12/22 113 lb 1.5 oz (51.3 kg)  07/28/22 113 lb (51.3 kg)    Physical Exam Vitals and nursing note reviewed.  Constitutional:      Appearance: She is well-developed.  HENT:     Head: Normocephalic and atraumatic.  Cardiovascular:     Rate and Rhythm: Normal rate and regular rhythm.     Heart sounds: Normal heart sounds. No murmur heard.    No friction rub. No gallop.  Pulmonary:     Effort: Pulmonary effort is normal. No tachypnea or respiratory distress.     Breath sounds: Normal breath sounds. No decreased breath sounds, wheezing, rhonchi or rales.  Chest:     Chest wall: No tenderness.  Abdominal:     General: Bowel sounds are normal.     Palpations: Abdomen is soft.  Musculoskeletal:        General: Normal range of motion.     Cervical back: Normal range of motion.  Skin:    General: Skin is warm and dry.  Neurological:     Mental Status: She is alert and oriented to person, place, and time.     Coordination: Coordination normal.  Psychiatric:        Behavior: Behavior normal. Behavior is cooperative.        Thought Content: Thought content normal.        Judgment: Judgment normal.          Patient has been counseled extensively about nutrition and exercise as well as the importance of adherence with medications and regular follow-up. The patient was given clear instructions to go to ER or return to medical center if symptoms don't improve, worsen or new problems develop. The patient verbalized understanding.   Follow-up: No follow-ups on file.   Claiborne Rigg, FNP-BC [facility]  [place], [place] [phone]   11/22/2022, 10:45 AM

## 2022-11-28 ENCOUNTER — Other Ambulatory Visit: Payer: Self-pay

## 2022-12-07 IMAGING — CT CT ABD-PELV W/ CM
2 of 4 series · 15 of 46 positions shown, 17 images · IV contrast (APPLIED)
Comparison: Right upper quadrant ultrasound yesterday.

CLINICAL DATA: Epigastric abdominal pain.

EXAM:
CT ABDOMEN AND PELVIS WITH CONTRAST
TECHNIQUE: Multidetector CT imaging of the abdomen and pelvis was performed
using the standard protocol following bolus administration of
intravenous contrast.
CONTRAST:  60mL OMNIPAQUE IOHEXOL 350 MG/ML SOLN

[Series 3: abdomen 5.0 · axial · 0.75mm/px · z∈[-764,-388]mm · 12 of 86 slices shown, 14 images]
[im 6/86  soft-tissue]
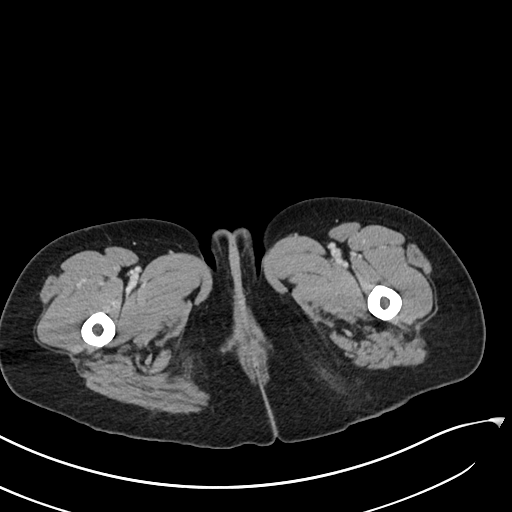
[im 6/86  bone]
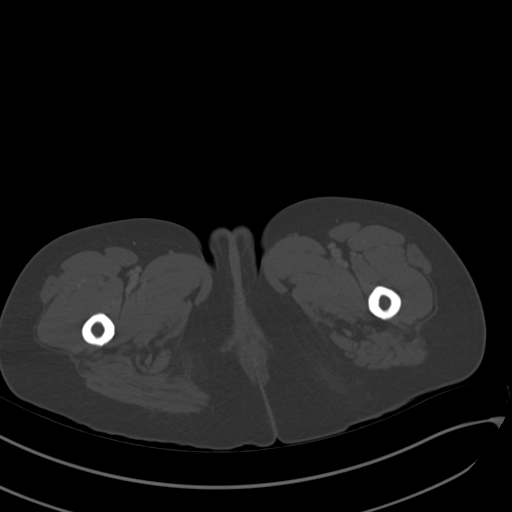
[im 16/86  soft-tissue]
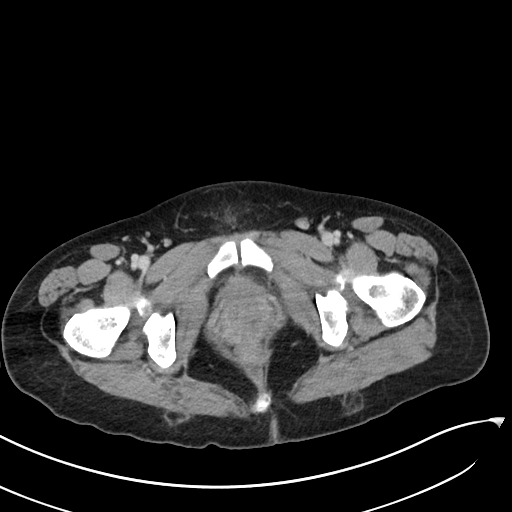
[im 21/86  soft-tissue]
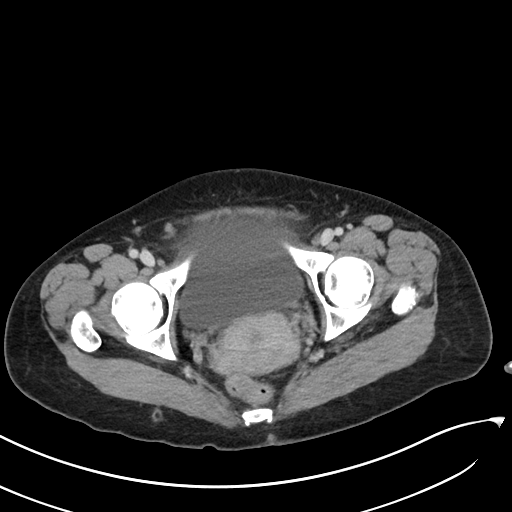
[im 26/86  soft-tissue]
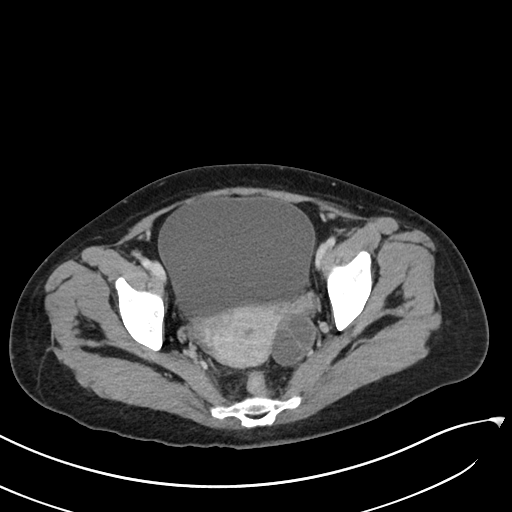
[im 36/86  soft-tissue]
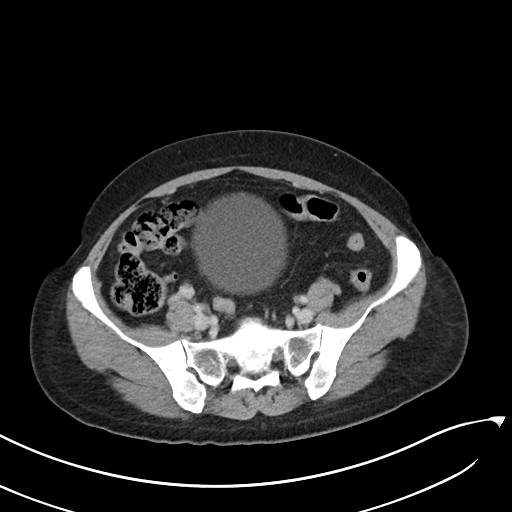
[im 41/86  soft-tissue]
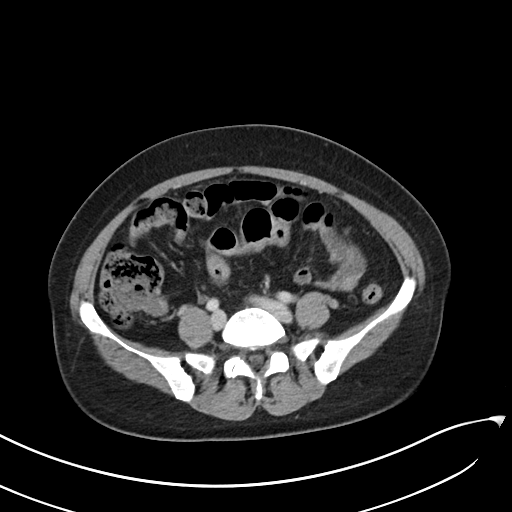
[im 46/86  soft-tissue]
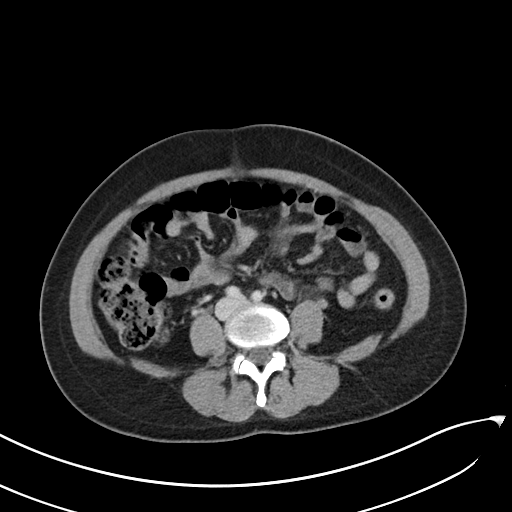
[im 56/86  soft-tissue]
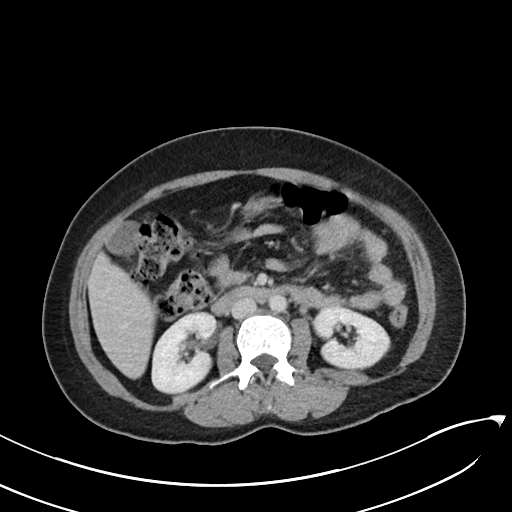
[im 61/86  soft-tissue]
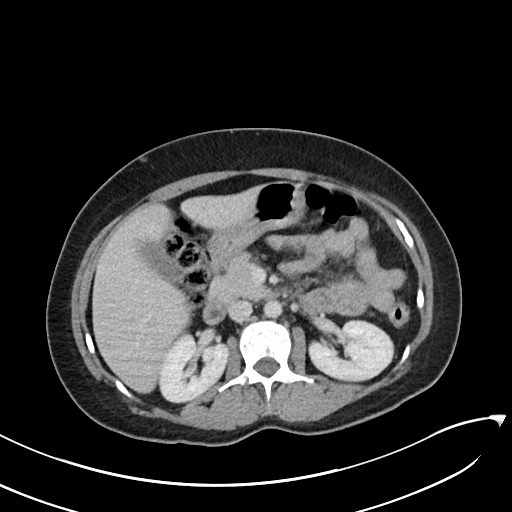
[im 61/86  bone]
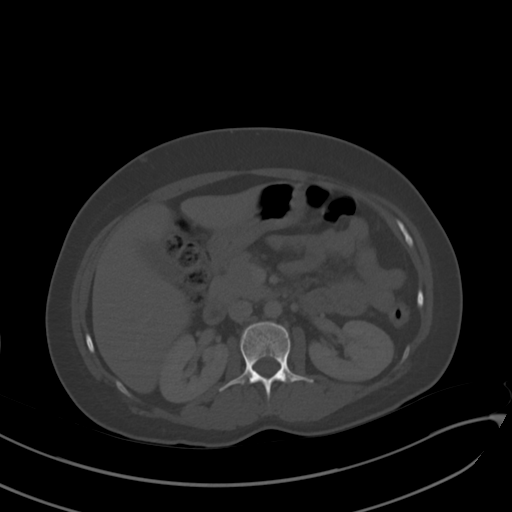
[im 66/86  soft-tissue]
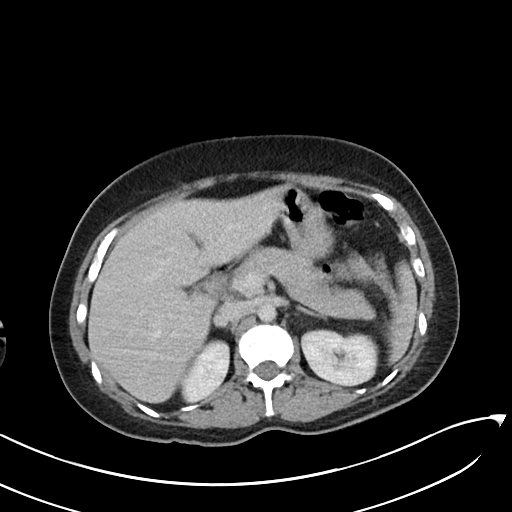
[im 76/86  soft-tissue]
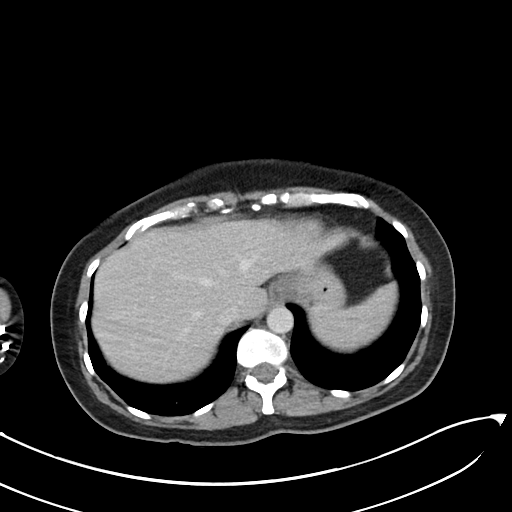
[im 81/86  soft-tissue]
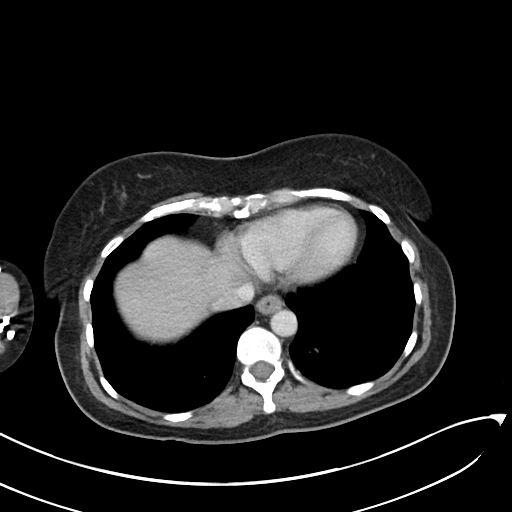

[Series 6: abdomen 3.0 mpr cor · coronal · 0.66mm/px · 3 of 82 slices shown]
[im 28/82  soft-tissue]
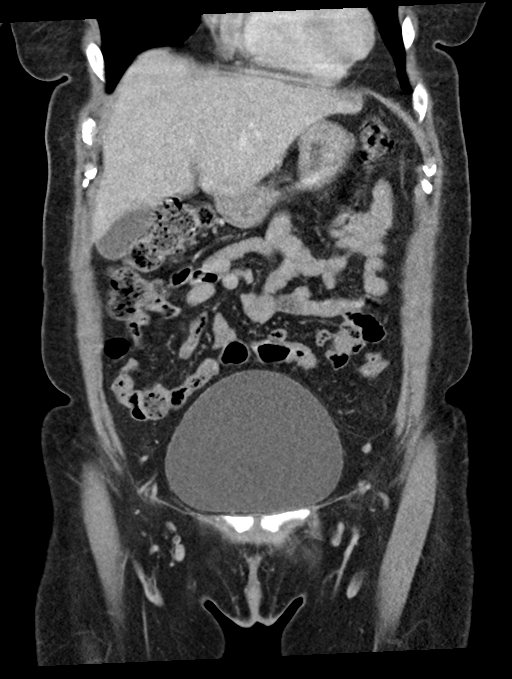
[im 37/82  soft-tissue]
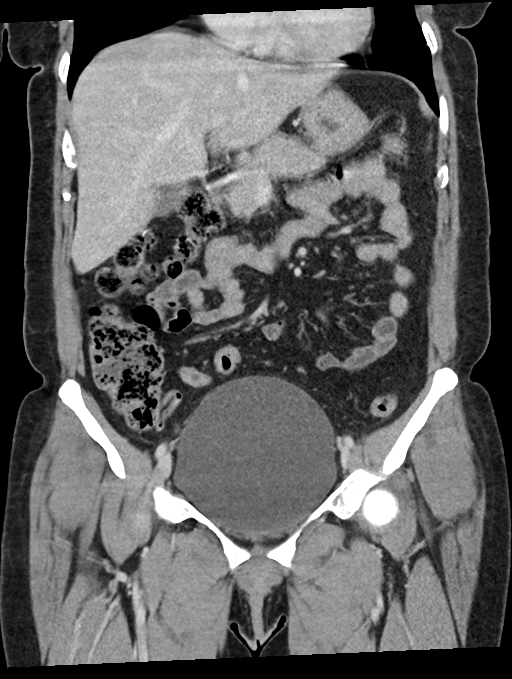
[im 46/82  soft-tissue]
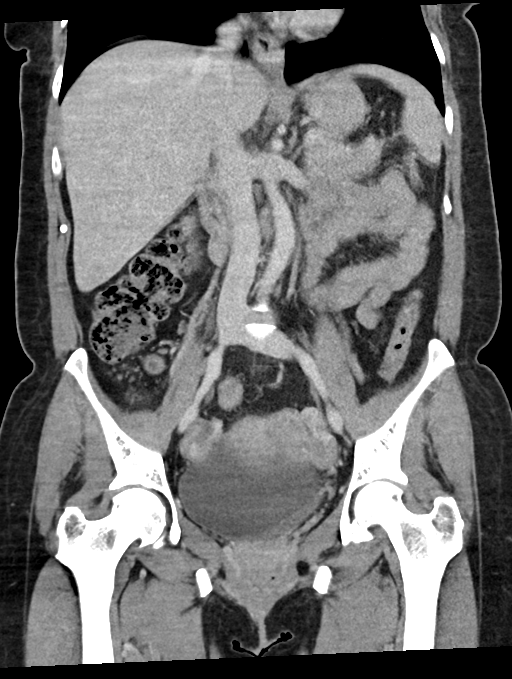

[15 of 46 positions shown; findings below may reference images not displayed]

FINDINGS: Lower chest: No acute airspace disease. No pleural effusion.
Calcified granuloma in the medial right lower lobe.

Hepatobiliary: No focal liver abnormality is seen. No gallstones,
gallbladder wall thickening, or biliary dilatation.

Pancreas: No ductal dilatation or inflammation.

Spleen: Normal in size without focal abnormality.

Adrenals/Urinary Tract: Normal adrenal glands. No hydronephrosis or
perinephric edema. Homogeneous renal enhancement evidence of stone
or focal renal lesion. Urinary bladder is distended without wall
thickening.

Stomach/Bowel: Unremarkable stomach. There is no small bowel
obstruction or inflammation. Normal appendix. Moderate stool in the
right colon, with small volume of stool distally. No colonic wall
thickening or inflammation.

Vascular/Lymphatic: Normal caliber abdominal aorta. No acute
vascular findings. Patent portal vein. No abdominopelvic adenopathy.

Reproductive: Anteverted uterus with mildly heterogeneous
myometrium, no discrete fibroid is seen. There is a septated left
ovarian cyst measuring 3.9 cm. There may be some peripheral septal
thickening. Normal CT appearance of the right ovary.

Other: Small fat containing umbilical hernia. No free air, free
fluid, or intra-abdominal fluid collection.

Musculoskeletal: There are no acute or suspicious osseous
abnormalities.
IMPRESSION: 1. No acute abnormality in the abdomen/pelvis.
2. Septated 3.9 cm left ovarian cyst. There may be some peripheral
septal thickening. Because this lesion is not adequately
characterized, prompt US is recommended for further evaluation.
Note: This recommendation does not apply to premenarchal patients
and to those with increased risk (genetic, family history, elevated
tumor markers or other high-risk factors) of ovarian cancer.
Reference: JACR [DATE]):[id number]
3. Small fat containing umbilical hernia.

## 2022-12-08 ENCOUNTER — Ambulatory Visit: Payer: Self-pay | Attending: Family Medicine | Admitting: Family Medicine

## 2022-12-09 ENCOUNTER — Other Ambulatory Visit: Payer: Self-pay

## 2022-12-15 ENCOUNTER — Ambulatory Visit: Payer: Self-pay | Admitting: Family Medicine

## 2022-12-20 ENCOUNTER — Telehealth: Payer: Self-pay

## 2022-12-20 ENCOUNTER — Other Ambulatory Visit: Payer: Self-pay

## 2022-12-20 ENCOUNTER — Other Ambulatory Visit: Payer: Self-pay | Admitting: Family Medicine

## 2022-12-20 MED ORDER — PANTOPRAZOLE SODIUM 20 MG PO TBEC
20.0000 mg | DELAYED_RELEASE_TABLET | Freq: Every day | ORAL | 0 refills | Status: DC
  Filled 2022-12-20: qty 30, 30d supply, fill #0

## 2022-12-20 NOTE — Addendum Note (Signed)
Addended by: Hoy Register on: 12/20/2022 12:36 PM   Modules accepted: Orders

## 2022-12-20 NOTE — Telephone Encounter (Signed)
Patient came in requesting a refill on Pantoprazole Sodium 20mg 

## 2022-12-20 NOTE — Telephone Encounter (Signed)
Refilled

## 2022-12-20 NOTE — Telephone Encounter (Signed)
Ok to fill this medication for patient?

## 2022-12-21 ENCOUNTER — Other Ambulatory Visit: Payer: Self-pay

## 2022-12-23 ENCOUNTER — Other Ambulatory Visit: Payer: Self-pay

## 2022-12-30 ENCOUNTER — Telehealth: Payer: Self-pay | Admitting: Gastroenterology

## 2022-12-30 ENCOUNTER — Telehealth: Payer: Self-pay | Admitting: Family Medicine

## 2022-12-30 ENCOUNTER — Ambulatory Visit: Payer: Self-pay

## 2022-12-30 NOTE — Telephone Encounter (Signed)
Called pt via interpreter services. He was unable to reach pt, call went to voicemail. Interpreter left a message for pt to call back.

## 2022-12-30 NOTE — Telephone Encounter (Signed)
Duplicate message. 

## 2022-12-30 NOTE — Telephone Encounter (Signed)
   Called Dr. Milas Hock office. Nurse Bonita Quin will call pt back. Spoke with [employer].  Pt called back. Pt missed a call from [facility]'s office gastro. Called that office to see if they could try to make contact again.  Advised UC if unable to reach gastro office.    Answer Assessment - Initial Assessment Questions 1. LOCATION: "Where does it hurt?"      Feels heaviness right sometimes on the left and  2. RADIATION: "Does the pain shoot anywhere else?" (e.g., chest, back)     *No Answer* 3. ONSET: "When did the pain begin?" (e.g., minutes, hours or days ago)      13 days  4. SUDDEN: "Gradual or sudden onset?"     *No Answer* 5. PATTERN "Does the pain come and go, or is it constant?"    - If it comes and goes: "How long does it last?" "Do you have pain now?"     (Note: Comes and goes means the pain is intermittent. It goes away completely between bouts.)    - If constant: "Is it getting better, staying the same, or getting worse?"      (Note: Constant means the pain never goes away completely; most serious pain is constant and gets worse.)      constant 6. SEVERITY: "How bad is the pain?"  (e.g., Scale 1-10; mild, moderate, or severe)    - MILD (1-3): Doesn't interfere with normal activities, abdomen soft and not tender to touch.     - MODERATE (4-7): Interferes with normal activities or awakens from sleep, abdomen tender to touch.     - SEVERE (8-10): Excruciating pain, doubled over, unable to do any normal activities.       Feels a heaviness - 1/10 7. RECURRENT SYMPTOM: "Have you ever had this type of stomach pain before?" If Yes, ask: "When was the last time?" and "What happened that time?"      yes 8. CAUSE: "What do you think is causing the stomach pain?"     *No Answer* 9. RELIEVING/AGGRAVATING FACTORS: "What makes it better or worse?" (e.g., antacids, bending or twisting motion, bowel movement)     *When pt gets angry pain get worse. 10. OTHER SYMPTOMS: "Do you have any  other symptoms?" (e.g., back pain, diarrhea, fever, urination pain, vomiting)       Gas, reflux, burping, heaviness doesn't allow her to eat because she feels like she is full all the time- Nausea Reason for Disposition  [1] MILD-MODERATE pain AND [2] constant AND [3] present > 2 hours  Protocols used: Abdominal Pain - [facility]

## 2022-12-30 NOTE — Telephone Encounter (Addendum)
Pt. Calling again for a response from earlier call. States she missed a call today. Instructed her GI doctor had tried to call her back. Verbalizes understanding. Used Spanish interpreter, [place], # [phone].   Used Research officer, [employer], # [id number]. Chief Complaint: Upper abdominal pain, bloating, burning sensation. Her prescribed medications are not helping. Asking for different medication or an appointment next Tuesday. Symptoms: Above Frequency: 12 days ago Pertinent Negatives: Patient denies vomiting Disposition: [] ED /[] Urgent Care (no appt availability in office) / [] Appointment(In office/virtual)/ []  [facility] [facility]/ [] Home Care/ [] Refused Recommended Disposition /[] [place]/ [x]  Follow-up with PCP Additional Notes: Please advise pt.  Reason for Disposition  [1] MILD pain (e.g., does not interfere with normal activities) AND [2] pain comes and goes (cramps) AND [3] present > 48 hours  (Exception: This same abdominal pain is a chronic symptom recurrent or ongoing AND present > 4 weeks.)  Answer Assessment - Initial Assessment Questions 1. LOCATION: "Where does it hurt?"      Upper 2. RADIATION: "Does the pain shoot anywhere else?" (e.g., chest, back)      3. ONSET: "When did the pain begin?" (e.g., minutes, hours or days ago)      12 days 4. SUDDEN: "Gradual or sudden onset?"     Sudden 5. PATTERN "Does the pain come and go, or is it constant?"    - If it comes and goes: "How long does it last?" "Do you have pain now?"     (Note: Comes and goes means the pain is intermittent. It goes away completely between bouts.)    - If constant: "Is it getting better, staying the same, or getting worse?"      (Note: Constant means the pain never goes away completely; most serious pain is constant and gets worse.)      Constant 6. SEVERITY: "How bad is the pain?"  (e.g., Scale 1-10; mild, moderate, or severe)    - MILD (1-3): Doesn't interfere with normal activities, abdomen  soft and not tender to touch.     - MODERATE (4-7): Interferes with normal activities or awakens from sleep, abdomen tender to touch.     - SEVERE (8-10): Excruciating pain, doubled over, unable to do any normal activities.       1 7. RECURRENT SYMPTOM: "Have you ever had this type of stomach pain before?" If Yes, ask: "When was the last time?" and "What happened that time?"      No 8. CAUSE: "What do you think is causing the stomach pain?"     Unsure 9. RELIEVING/AGGRAVATING FACTORS: "What makes it better or worse?" (e.g., antacids, bending or twisting motion, bowel movement)     No 10. OTHER SYMPTOMS: "Do you have any other symptoms?" (e.g., back pain, diarrhea, fever, urination pain, vomiting)       Bloating, burning 11. PREGNANCY: "Is there any chance you are pregnant?" "When was your last menstrual period?"       No  Protocols used: Abdominal Pain - [facility]

## 2022-12-30 NOTE — Telephone Encounter (Signed)
Duplicate encounter / concerns in encounter.

## 2022-12-30 NOTE — Telephone Encounter (Signed)
Copied from CRM [id number]. Topic: General - Inquiry >> Dec 30, 2022  1:08 PM Lennox Pippins wrote: Patient has called back stating she is waiting to hear back from her PCP and has not heard anything, regarding the telephone encounter with NT today, patient stated PCP office is supposed to call her back.  Patients callback # [phone]

## 2022-12-30 NOTE — Telephone Encounter (Signed)
Sx started 3 ays Answer Assessment - Initial Assessment Questions 1. LOCATION: "Where does it hurt?"      Feels heaviness right sometimes on the left and  2. RADIATION: "Does the pain shoot anywhere else?" (e.g., chest, back)     *No Answer* 3. ONSET: "When did the pain begin?" (e.g., minutes, hours or days ago)      13 days  4. SUDDEN: "Gradual or sudden onset?"     *No Answer* 5. PATTERN "Does the pain come and go, or is it constant?"    - If it comes and goes: "How long does it last?" "Do you have pain now?"     (Note: Comes and goes means the pain is intermittent. It goes away completely between bouts.)    - If constant: "Is it getting better, staying the same, or getting worse?"      (Note: Constant means the pain never goes away completely; most serious pain is constant and gets worse.)      constant 6. SEVERITY: "How bad is the pain?"  (e.g., Scale 1-10; mild, moderate, or severe)    - MILD (1-3): Doesn't interfere with normal activities, abdomen soft and not tender to touch.     - MODERATE (4-7): Interferes with normal activities or awakens from sleep, abdomen tender to touch.     - SEVERE (8-10): Excruciating pain, doubled over, unable to do any normal activities.       Feels a heaviness 7. RECURRENT SYMPTOM: "Have you ever had this type of stomach pain before?" If Yes, ask: "When was the last time?" and "What happened that time?"      yes 8. CAUSE: "What do you think is causing the stomach pain?"     *No Answer* 9. RELIEVING/AGGRAVATING FACTORS: "What makes it better or worse?" (e.g., antacids, bending or twisting motion, bowel movement)     *No Answer* 10. OTHER SYMPTOMS: "Do you have any other symptoms?" (e.g., back pain, diarrhea, fever, urination pain, vomiting)       Gas, reflux, burping, heaviness doesn't allow her to eat because she feels like she is full all the time-  Protocols used: Abdominal Pain - [facility]

## 2022-12-30 NOTE — Telephone Encounter (Signed)
[facility] is calling to see if this patient can be called back and put into triage. She has called constantly to see if we were going to schedule her. I told them the first availability will be Please advise.

## 2022-12-30 NOTE — Telephone Encounter (Signed)
Call was dropped during assessment- interpreter [facility] call pt but had to LM on VM

## 2022-12-30 NOTE — Telephone Encounter (Signed)
Inbound call from patient states she is experiencing abdominal pain. Patient states she feel full and when she goes to brush her teeth she satrts to spit up a yellowish brownish fluid. Spoke with a nurse and based off patient symptoms she was advised to go the the ER. Patient stated that going to the ER was costly and the last time she went on 6/15 they did not provide her with any medication. Patient inquiring if there is a medication that she can take for the swelling and spitting up. Please advise.   Thank you

## 2022-12-31 ENCOUNTER — Other Ambulatory Visit: Payer: Self-pay

## 2022-12-31 ENCOUNTER — Emergency Department (HOSPITAL_COMMUNITY)
Admission: EM | Admit: 2022-12-31 | Discharge: 2023-01-01 | Disposition: A | Discharge status: Home / Self Care | Payer: Self-pay | Attending: Emergency Medicine | Admitting: Emergency Medicine

## 2022-12-31 ENCOUNTER — Encounter (HOSPITAL_COMMUNITY): Payer: Self-pay

## 2022-12-31 DIAGNOSIS — R1013 Epigastric pain: Secondary | ICD-10-CM | POA: Insufficient documentation

## 2022-12-31 LAB — URINALYSIS, ROUTINE W REFLEX MICROSCOPIC
Bacteria, UA: NONE SEEN
Bilirubin Urine: NEGATIVE
Glucose, UA: NEGATIVE mg/dL
Ketones, ur: NEGATIVE mg/dL
Leukocytes,Ua: NEGATIVE
Nitrite: NEGATIVE
Protein, ur: NEGATIVE mg/dL
Specific Gravity, Urine: 1.012 (ref 1.005–1.030)
pH: 5 (ref 5.0–8.0)

## 2022-12-31 LAB — COMPREHENSIVE METABOLIC PANEL
ALT: 15 U/L (ref 0–44)
AST: 19 U/L (ref 15–41)
Albumin: 3.8 g/dL (ref 3.5–5.0)
Alkaline Phosphatase: 75 U/L (ref 38–126)
Anion gap: 10 (ref 5–15)
BUN: 13 mg/dL (ref 6–20)
CO2: 22 mmol/L (ref 22–32)
Calcium: 8.9 mg/dL (ref 8.9–10.3)
Chloride: 102 mmol/L (ref 98–111)
Creatinine, Ser: 0.85 mg/dL (ref 0.44–1.00)
GFR, Estimated: >60 mL/min (ref >60)
Glucose, Bld: 137 mg/dL — ABNORMAL HIGH (ref 70–99)
Potassium: 3.5 mmol/L (ref 3.5–5.1)
Sodium: 134 mmol/L — ABNORMAL LOW (ref 135–145)
Total Bilirubin: 0.6 mg/dL (ref 0.3–1.2)
Total Protein: 7.6 g/dL (ref 6.5–8.1)

## 2022-12-31 LAB — CBC
HCT: 44 % (ref 36.0–46.0)
Hemoglobin: 13.9 g/dL (ref 12.0–15.0)
MCH: 28.1 pg (ref 26.0–34.0)
MCHC: 31.6 g/dL (ref 30.0–36.0)
MCV: 89.1 fL (ref 80.0–100.0)
Platelets: 311 10*3/uL (ref 150–400)
RBC: 4.94 MIL/uL (ref 3.87–5.11)
RDW: 14.4 % (ref 11.5–15.5)
WBC: 10.8 10*3/uL — ABNORMAL HIGH (ref 4.0–10.5)
nRBC: 0 % (ref 0.0–0.2)

## 2022-12-31 LAB — LIPASE, BLOOD: Lipase: 33 U/L (ref 11–51)

## 2022-12-31 LAB — HCG, SERUM, QUALITATIVE: Preg, Serum: NEGATIVE

## 2022-12-31 MED ORDER — FAMOTIDINE 20 MG PO TABS
20.0000 mg | ORAL_TABLET | Freq: Once | ORAL | Status: AC
  Administered 2022-12-31: 20 mg via ORAL
  Filled 2022-12-31: qty 1

## 2022-12-31 MED ORDER — DICYCLOMINE HCL 10 MG PO CAPS
10.0000 mg | ORAL_CAPSULE | Freq: Once | ORAL | Status: AC
  Administered 2022-12-31: 10 mg via ORAL
  Filled 2022-12-31: qty 1

## 2022-12-31 NOTE — ED Triage Notes (Signed)
Pt arrives with c/o ABD pain for 12 days. Pt endorses n/v. Pt describes pain as a burning sensation.

## 2023-01-01 ENCOUNTER — Emergency Department (HOSPITAL_COMMUNITY): Payer: Self-pay

## 2023-01-01 LAB — TROPONIN I (HIGH SENSITIVITY)
Troponin I (High Sensitivity): 3 ng/L (ref <18)
Troponin I (High Sensitivity): <2 ng/L (ref <18)

## 2023-01-01 MED ORDER — ALUM & MAG HYDROXIDE-SIMETH 200-200-20 MG/5ML PO SUSP
30.0000 mL | Freq: Once | ORAL | Status: AC
  Administered 2023-01-01: 30 mL via ORAL
  Filled 2023-01-01: qty 30

## 2023-01-01 MED ORDER — IOHEXOL 350 MG/ML SOLN
75.0000 mL | Freq: Once | INTRAVENOUS | Status: AC | PRN
  Administered 2023-01-01: 75 mL via INTRAVENOUS

## 2023-01-01 MED ORDER — PANTOPRAZOLE SODIUM 40 MG PO TBEC
40.0000 mg | DELAYED_RELEASE_TABLET | Freq: Every day | ORAL | 0 refills | Status: DC
  Filled 2023-01-01 – 2023-03-31 (×2): qty 30, 30d supply, fill #0

## 2023-01-01 NOTE — ED Provider Notes (Signed)
EMERGENCY DEPARTMENT AT [facility] Provider Note   CSN: [id number] Arrival date & time: 12/31/22  2127     History  Chief Complaint  Patient presents with   Abdominal Pain    Hayley Garcia Hayley Garcia is a 47 y.o. female.  HPI    Spanish interpreter was used to collect HPI  Patient with medical history including GERD, epigastric pain, depression, prediabetes, presented with complaints of epigastric tenderness.  Patient has been on for last 12 days, states is constant, described as a pressure-like burning-like sensation, worsening with being supine and eating and drinking, associated nausea and vomiting, states she is still passing gas having normal bowel movements, states she does have urinary frequency but this seems to be normal for her, denies any dysuria or hematuria, she has no cardiac history, no history of PEs or DVTs currently not oral birth control no recent surgeries no long immobilization denies any calf pain leg swelling.  Patient states that she has had similar in the past was feels slightly worse, she has no other complaints.  I reviewed patient's chart was seen previously for similar presentation, has had an ultrasound which was negative for acute findings, head CT imaging was also negative for acute findings, she underwent EDG which was also negative acute findings.  Patient states that she has been trying omeprazole without much relief.    Home Medications Prior to Admission medications   Medication Sig Start Date End Date Taking? Authorizing Provider  pantoprazole (PROTONIX) 40 MG tablet Take 1 tablet (40 mg total) by mouth daily. 01/01/23 01/31/23 Yes Carroll Sage, PA-C  albuterol (VENTOLIN HFA) 108 (90 Base) MCG/ACT inhaler Inhale 2 puffs into the lungs every 6 (six) hours as needed for wheezing or shortness of breath. 11/22/22   Claiborne Rigg, NP  amitriptyline (ELAVIL) 10 MG tablet Take 1 tablet (10 mg total) by mouth at bedtime. 06/09/22    Jenel Lucks, MD  FLUoxetine (PROZAC) 20 MG capsule Take 1 capsule (20 mg total) by mouth daily. Patient not taking: Reported on 06/09/2022 03/03/22   Hoy Register, MD  metoCLOPramide (REGLAN) 10 MG tablet Take 1 tablet (10 mg total) by mouth every 6 (six) hours. 11/13/22   Gailen Shelter, PA  sucralfate (CARAFATE) 1 GM/10ML suspension Take 10 mLs (1 g total) by mouth 4 (four) times daily -  with meals and at bedtime. 11/13/22   Gailen Shelter, PA      Allergies    Patient has no known allergies.    Review of Systems   Review of Systems  Constitutional:  Negative for chills and fever.  Respiratory:  Negative for shortness of breath.   Cardiovascular:  Negative for chest pain.  Gastrointestinal:  Positive for abdominal pain and nausea. Negative for vomiting.  Genitourinary:  Positive for frequency. Negative for flank pain.  Neurological:  Negative for headaches.    Physical Exam Updated Vital Signs BP 112/72 (BP Location: Right Arm)   Pulse 72   Temp 98.1 F (36.7 C) (Oral)   Resp 18   Wt 54.4 kg   SpO2 99%   BMI 24.24 kg/m  Physical Exam Vitals and nursing note reviewed.  Constitutional:      General: She is not in acute distress.    Appearance: She is not ill-appearing.  HENT:     Head: Normocephalic and atraumatic.     Nose: No congestion.  Eyes:     Conjunctiva/sclera: Conjunctivae normal.  Cardiovascular:  Rate and Rhythm: Normal rate and regular rhythm.     Pulses: Normal pulses.     Heart sounds: No murmur heard.    No friction rub. No gallop.  Pulmonary:     Effort: No respiratory distress.     Breath sounds: No wheezing, rhonchi or rales.  Abdominal:     Palpations: Abdomen is soft.     Tenderness: There is abdominal tenderness. There is no right CVA tenderness or left CVA tenderness.     Comments: Abdomen nondistended, soft, notable epigastric tenderness, no right upper quadrant tenderness, there is no guarding rebound has or peritoneal  signs negative Murphy's and McBurney point.  Musculoskeletal:     Right lower leg: No edema.     Left lower leg: No edema.     Comments: No unilateral leg swelling no calf tenderness no palpable cords.  Skin:    General: Skin is warm and dry.  Neurological:     Mental Status: She is alert.  Psychiatric:        Mood and Affect: Mood normal.     ED Results / Procedures / Treatments   Labs (all labs ordered are listed, but only abnormal results are displayed) Labs Reviewed  COMPREHENSIVE METABOLIC PANEL - Abnormal; Notable for the following components:      Result Value   Sodium 134 (*)    Glucose, Bld 137 (*)    All other components within normal limits  CBC - Abnormal; Notable for the following components:   WBC 10.8 (*)    All other components within normal limits  URINALYSIS, ROUTINE W REFLEX MICROSCOPIC - Abnormal; Notable for the following components:   Hgb urine dipstick MODERATE (*)    All other components within normal limits  LIPASE, BLOOD  HCG, SERUM, QUALITATIVE  TROPONIN I (HIGH SENSITIVITY)  TROPONIN I (HIGH SENSITIVITY)    EKG EKG Interpretation Date/Time:  Saturday December 31 2022 23:16:32 EDT Ventricular Rate:  74 PR Interval:  130 QRS Duration:  90 QT Interval:  366 QTC Calculation: 406 R Axis:   72  Text Interpretation: Normal sinus rhythm Possible Anteroseptal infarct , age undetermined Abnormal ECG Confirmed by Zadie Rhine ([id number]) on 12/31/2022 11:24:23 PM  Radiology CT ABDOMEN PELVIS W CONTRAST  Result Date: 01/01/2023 CLINICAL DATA:  Epigastric abdominal pain EXAM: CT ABDOMEN AND PELVIS WITH CONTRAST TECHNIQUE: Multidetector CT imaging of the abdomen and pelvis was performed using the standard protocol following bolus administration of intravenous contrast. RADIATION DOSE REDUCTION: This exam was performed according to the departmental dose-optimization program which includes automated exposure control, adjustment of the mA and/or kV according to  patient size and/or use of iterative reconstruction technique. CONTRAST:  75mL OMNIPAQUE IOHEXOL 350 MG/ML SOLN COMPARISON:  05/28/2022 FINDINGS: Lower chest: No acute abnormality. Hepatobiliary: Flash fill 16 mm hemangioma within the right hepatic dome again noted. Liver otherwise unremarkable. Gallbladder unremarkable. No intra or extrahepatic biliary ductal dilation. Pancreas: Unremarkable Spleen: Unremarkable Adrenals/Urinary Tract: Adrenal glands are unremarkable. Kidneys are normal, without renal calculi, focal lesion, or hydronephrosis. Bladder is unremarkable. Stomach/Bowel: Stomach is within normal limits. Appendix appears normal. No evidence of bowel wall thickening, distention, or inflammatory changes. Vascular/Lymphatic: No significant vascular findings are present. No enlarged abdominal or pelvic lymph nodes. Reproductive: Uterus and bilateral adnexa are unremarkable. Other: Tiny fat containing umbilical hernia. No abdominopelvic ascites. Musculoskeletal: No acute or significant osseous findings. IMPRESSION: 1. No acute intra-abdominal pathology identified. No definite radiographic explanation for the patient's reported symptoms. Electronically Signed  By: Helyn Numbers M.D.   On: 01/01/2023 04:06   US Abdomen Limited  Result Date: 01/01/2023 CLINICAL DATA:  Right upper quadrant pain. EXAM: ULTRASOUND ABDOMEN LIMITED RIGHT UPPER QUADRANT COMPARISON:  05/28/2022. FINDINGS: Gallbladder: No gallstones or wall thickening visualized. No sonographic Murphy sign noted by sonographer. Common bile duct: Diameter: 2.8 mm Liver: No focal lesion identified. Increased parenchymal echogenicity. Portal vein is patent on color Doppler imaging with normal direction of blood flow towards the liver. Other: No free fluid. IMPRESSION: 1. No cholelithiasis or acute cholecystitis. 2. Hepatic steatosis. Electronically Signed   By: Thornell Sartorius M.D.   On: 01/01/2023 00:43    Procedures Procedures    Medications  Ordered in ED Medications  famotidine (PEPCID) tablet 20 mg (20 mg Oral Given 12/31/22 2327)  dicyclomine (BENTYL) capsule 10 mg (10 mg Oral Given 12/31/22 2327)  alum & mag hydroxide-simeth (MAALOX/MYLANTA) 200-200-20 MG/5ML suspension 30 mL (30 mLs Oral Given 01/01/23 0128)  iohexol (OMNIPAQUE) 350 MG/ML injection 75 mL (75 mLs Intravenous Contrast Given 01/01/23 0348)    ED Course/ Medical Decision Making/ A&P                                 Medical Decision Making Amount and/or Complexity of Data Reviewed Labs: ordered. Radiology: ordered.  Risk OTC drugs. Prescription drug management.   This patient presents to the ED for concern of epigastric pain, this involves an extensive number of treatment options, and is a complaint that carries with it a high risk of complications and morbidity.  The differential diagnosis includes cholecystitis/cholangitis, pancreatitis, bowel obstruction, volvulus, lower lobe pneumonia, ACS,    Additional history obtained:  Additional history obtained from husband bedside External records from outside source obtained and reviewed including ER notes   Co morbidities that complicate the patient evaluation  GERD  Social Determinants of Health:  Non English speaking    Lab Tests:  I Ordered, and personally interpreted labs.  The pertinent results include: CBC shows leukocytosis of 10.8, CMP reveals sodium 134 glucose 137 first troponin is 3 lipase 33   Imaging Studies ordered:  I ordered imaging studies including limited ultrasound, CT abdomen pelvis I independently visualized and interpreted imaging which showed negative for acute findings, CT scan negative acute findings I agree with the radiologist interpretation   Cardiac Monitoring:  The patient was maintained on a cardiac monitor.  I personally viewed and interpreted the cardiac monitored which showed an underlying rhythm of: Without signs of ischemia   Medicines ordered and  prescription drug management:  I ordered medication including Pepcid, Bentyl I have reviewed the patients home medicines and have made adjustments as needed  Critical Interventions:  N/A   Reevaluation:  Present with abdominal pain, she had no epigastric tenderness, will obtain screening lab workup, add on a ultrasound for further evaluation for concern of possible cholecystitis  Patient was reassessed she is found resting comfortably, vital signs are reassuring, reassess her abdomen still has slight epigastric tenderness, she states overall her pain is improving but is still having pain, she does have a slight leukocytosis, unclear etiology, will send down for CT abdomen pelvis for further evaluation to exclude possible pancreatitis, diverticulitis, will provide a GI cocktail and reassess.  I reassessed the patient she was found asleep, she has no more complaints, updated on her imaging, she is given discharge at this time.    Consultations Obtained:  N/a  Test Considered:  N/a    Rule out I have low suspicion for ACS as history is atypical, patient has no cardiac history, EKG was sinus rhythm without signs of ischemia, patient has a negative delta troponin.  Low suspicion for PE as patient denies pleuritic chest pain, shortness of breath, patient denies leg pain, no pedal edema noted on exam, patient was PERC negative.  Low suspicion for AAA or aortic dissection as history is atypical, patient has low risk factors. low suspicion for lower lobe pneumonia as lung sounds are clear bilaterally, will defer imaging at this time.  I have low suspicion for liver or gallbladder abnormality as she has no right upper quadrant tenderness, liver enzymes, alk phos, T bili all within normal limits ultrasound as well as CT imaging are negative.  Low suspicion for pancreatitis as lipase is within normal limits.  Low suspicion for ruptured stomach ulcer as she has no peritoneal sign present on  exam.  I doubt bowel obstruction, volvulus, diverticulitis, intra-abdominal mass or infection CT imaging negative these findings.   Dispostion and problem list  After consideration of the diagnostic results and the patients response to treatment, I feel that the patent would benefit from discharge  Epigastric pain-likely secondary due to GERD, will up her Prilosec to 40 mg, provided with food recommendations, and have her follow-up with GI for further assessment sugar precautions.           Final Clinical Impression(s) / ED Diagnoses Final diagnoses:  Epigastric pain    Rx / DC Orders ED Discharge Orders          Ordered    pantoprazole (PROTONIX) 40 MG tablet  Daily        01/01/23 0521              Carroll Sage, PA-C 01/01/23 Ok Edwards, MD 01/01/23 [phone]

## 2023-01-01 NOTE — Discharge Instructions (Signed)
Lab workup and imaging are reassuring, I have increased your Protonix to 40 mg daily please take as prescribed.  I given you recommendations for foods to help decrease acid reflux please review  Follow-up with GI for further assessment.  Come back to the emergency department if you develop chest pain, shortness of breath, severe abdominal pain, uncontrolled nausea, vomiting, diarrhea.

## 2023-01-02 ENCOUNTER — Other Ambulatory Visit: Payer: Self-pay

## 2023-01-02 NOTE — Telephone Encounter (Signed)
Attempted to reach patient twice. Her phone line goes straight to vm. I left a voicemail in spanish asking that patient give [place] a call back to schedule a follow up.

## 2023-01-04 NOTE — Telephone Encounter (Signed)
Unable to reach pt by phone. Pt scheduled to see Willette Cluster NP 01/26/23 at 1:30pm. Appt letter mailed to pt.

## 2023-01-09 ENCOUNTER — Other Ambulatory Visit: Payer: Self-pay

## 2023-01-10 ENCOUNTER — Ambulatory Visit: Payer: Self-pay | Admitting: Family Medicine

## 2023-01-10 ENCOUNTER — Other Ambulatory Visit: Payer: Self-pay

## 2023-01-26 ENCOUNTER — Ambulatory Visit: Payer: Self-pay | Admitting: Nurse Practitioner

## 2023-03-14 ENCOUNTER — Ambulatory Visit: Payer: Self-pay

## 2023-03-14 NOTE — Telephone Encounter (Signed)
Interpreter Irving Burton [phone] Chief Complaint: abdominal pain Symptoms: L lower abdominal pain, comes and goes, 3/10, burning sensation there too, nausea but went away, fatigue Frequency: 1 month Pertinent Negatives: Patient denies diarrhea or vomiting Disposition: [] ED /[] Urgent Care (no appt availability in office) / [x] Appointment(In office/virtual)/ []  [facility]/ [] Home Care/ [] Refused Recommended Disposition /[]  Mobile Bus/ []  Follow-up with PCP Additional Notes: pt calling to report above sx been going on x 1 month. Pt states she fixes some potato juice or drinks tea and does help some. Pt wanting to be seen. No appts until 04/05/23, pt has OV on 03/28/23 at 0830 with PCP, advised if she wants to be seen sooner she can go to [facility] and provided location detail for today. Pt prefers to keep appt and go to clinic.   Reason for Disposition  Abdominal pain is a chronic symptom (recurrent or ongoing AND present > 4 weeks)  Answer Assessment - Initial Assessment Questions 1. LOCATION: "Where does it hurt?"      L sided abdomen  2. RADIATION: "Does the pain shoot anywhere else?" (e.g., chest, back)     no 3. ONSET: "When did the pain begin?" (e.g., minutes, hours or days ago)      1 month  5. PATTERN "Does the pain come and go, or is it constant?"    - If it comes and goes: "How long does it last?" "Do you have pain now?"     (Note: Comes and goes means the pain is intermittent. It goes away completely between bouts.)    - If constant: "Is it getting better, staying the same, or getting worse?"      (Note: Constant means the pain never goes away completely; most serious pain is constant and gets worse.)      Comes and goes  6. SEVERITY: "How bad is the pain?"  (e.g., Scale 1-10; mild, moderate, or severe)    - MILD (1-3): Doesn't interfere with normal activities, abdomen soft and not tender to touch.     - MODERATE (4-7): Interferes with normal activities or awakens from  sleep, abdomen tender to touch.     - SEVERE (8-10): Excruciating pain, doubled over, unable to do any normal activities.       3/10 9. RELIEVING/AGGRAVATING FACTORS: "What makes it better or worse?" (e.g., antacids, bending or twisting motion, bowel movement)     Potato juice she makes helps 10. OTHER SYMPTOMS: "Do you have any other symptoms?" (e.g., back pain, diarrhea, fever, urination pain, vomiting)  Burning sensation in stomach in lower part, nausea but not now  Protocols used: Abdominal Pain - [facility]

## 2023-03-28 ENCOUNTER — Ambulatory Visit: Payer: Self-pay | Admitting: Family Medicine

## 2023-03-31 ENCOUNTER — Other Ambulatory Visit: Payer: Self-pay

## 2023-03-31 ENCOUNTER — Ambulatory Visit: Payer: Self-pay | Admitting: Physician Assistant

## 2023-04-06 ENCOUNTER — Other Ambulatory Visit: Payer: Self-pay

## 2023-04-06 ENCOUNTER — Emergency Department (HOSPITAL_COMMUNITY)
Admission: EM | Admit: 2023-04-06 | Discharge: 2023-04-07 | Disposition: A | Discharge status: Home / Self Care | Payer: Self-pay | Attending: Emergency Medicine | Admitting: Emergency Medicine

## 2023-04-06 DIAGNOSIS — R110 Nausea: Secondary | ICD-10-CM | POA: Insufficient documentation

## 2023-04-06 DIAGNOSIS — R1013 Epigastric pain: Secondary | ICD-10-CM | POA: Insufficient documentation

## 2023-04-06 DIAGNOSIS — R197 Diarrhea, unspecified: Secondary | ICD-10-CM | POA: Insufficient documentation

## 2023-04-07 ENCOUNTER — Emergency Department (HOSPITAL_COMMUNITY): Payer: Self-pay

## 2023-04-07 ENCOUNTER — Other Ambulatory Visit: Payer: Self-pay

## 2023-04-07 ENCOUNTER — Other Ambulatory Visit (HOSPITAL_BASED_OUTPATIENT_CLINIC_OR_DEPARTMENT_OTHER): Payer: Self-pay

## 2023-04-07 LAB — COMPREHENSIVE METABOLIC PANEL
ALT: 15 U/L (ref 0–44)
AST: 18 U/L (ref 15–41)
Albumin: 3.5 g/dL (ref 3.5–5.0)
Alkaline Phosphatase: 78 U/L (ref 38–126)
Anion gap: 9 (ref 5–15)
BUN: 15 mg/dL (ref 6–20)
CO2: 24 mmol/L (ref 22–32)
Calcium: 8.6 mg/dL — ABNORMAL LOW (ref 8.9–10.3)
Chloride: 104 mmol/L (ref 98–111)
Creatinine, Ser: 0.56 mg/dL (ref 0.44–1.00)
GFR, Estimated: >60 mL/min (ref >60)
Glucose, Bld: 100 mg/dL — ABNORMAL HIGH (ref 70–99)
Potassium: 3.6 mmol/L (ref 3.5–5.1)
Sodium: 137 mmol/L (ref 135–145)
Total Bilirubin: 0.3 mg/dL (ref <1.2)
Total Protein: 7 g/dL (ref 6.5–8.1)

## 2023-04-07 LAB — CBC
HCT: 42.2 % (ref 36.0–46.0)
Hemoglobin: 13.2 g/dL (ref 12.0–15.0)
MCH: 27.5 pg (ref 26.0–34.0)
MCHC: 31.3 g/dL (ref 30.0–36.0)
MCV: 87.9 fL (ref 80.0–100.0)
Platelets: 286 10*3/uL (ref 150–400)
RBC: 4.8 MIL/uL (ref 3.87–5.11)
RDW: 14.2 % (ref 11.5–15.5)
WBC: 9 10*3/uL (ref 4.0–10.5)
nRBC: 0 % (ref 0.0–0.2)

## 2023-04-07 LAB — URINALYSIS, ROUTINE W REFLEX MICROSCOPIC
Bacteria, UA: NONE SEEN
Bilirubin Urine: NEGATIVE
Glucose, UA: NEGATIVE mg/dL
Ketones, ur: NEGATIVE mg/dL
Nitrite: NEGATIVE
Protein, ur: NEGATIVE mg/dL
Specific Gravity, Urine: 1.015 (ref 1.005–1.030)
pH: 5 (ref 5.0–8.0)

## 2023-04-07 LAB — LIPASE, BLOOD: Lipase: 31 U/L (ref 11–51)

## 2023-04-07 LAB — HCG, SERUM, QUALITATIVE: Preg, Serum: NEGATIVE

## 2023-04-07 MED ORDER — FAMOTIDINE 20 MG PO TABS
20.0000 mg | ORAL_TABLET | Freq: Once | ORAL | Status: AC
  Administered 2023-04-07: 20 mg via ORAL
  Filled 2023-04-07: qty 1

## 2023-04-07 MED ORDER — PANTOPRAZOLE SODIUM 20 MG PO TBEC
20.0000 mg | DELAYED_RELEASE_TABLET | Freq: Every day | ORAL | 0 refills | Status: DC
  Filled 2023-04-07 (×2): qty 14, 14d supply, fill #0

## 2023-04-07 MED ORDER — LIDOCAINE VISCOUS HCL 2 % MT SOLN
15.0000 mL | Freq: Once | OROMUCOSAL | Status: AC
  Administered 2023-04-07: 15 mL via ORAL
  Filled 2023-04-07: qty 15

## 2023-04-07 MED ORDER — SUCRALFATE 1 GM/10ML PO SUSP
1.0000 g | Freq: Three times a day (TID) | ORAL | 0 refills | Status: DC
  Filled 2023-04-07 (×2): qty 420, 11d supply, fill #0

## 2023-04-07 MED ORDER — FAMOTIDINE 20 MG PO TABS: 20.0000 mg | ORAL_TABLET | Freq: Two times a day (BID) | ORAL | 0 refills | Status: DC

## 2023-04-07 MED ORDER — PANTOPRAZOLE SODIUM 40 MG PO TBEC: 40.0000 mg | DELAYED_RELEASE_TABLET | Freq: Every day | ORAL | Status: DC

## 2023-04-07 MED ORDER — ALUM & MAG HYDROXIDE-SIMETH 200-200-20 MG/5ML PO SUSP
30.0000 mL | Freq: Once | ORAL | Status: AC
  Administered 2023-04-07: 30 mL via ORAL
  Filled 2023-04-07: qty 30

## 2023-04-07 MED ORDER — DICYCLOMINE HCL 10 MG PO CAPS
10.0000 mg | ORAL_CAPSULE | Freq: Once | ORAL | Status: AC
Start: 2023-04-07 — End: 2023-04-07
  Administered 2023-04-07: 10 mg via ORAL
  Filled 2023-04-07: qty 1

## 2023-04-07 MED ORDER — SIMETHICONE 40 MG/0.6ML PO SUSP (UNIT DOSE)
40.0000 mg | Freq: Four times a day (QID) | ORAL | 0 refills | Status: DC | PRN
  Filled 2023-04-07: qty 45, 19d supply, fill #0

## 2023-04-07 NOTE — ED Provider Notes (Signed)
[place] EMERGENCY DEPARTMENT AT [facility] Provider Note   CSN: [id number] Arrival date & time: 04/06/23  2306     History  Chief Complaint  Patient presents with   Abdominal Pain    Hayley Garcia is a 47 y.o. female.  39 female presents ER today secondary to abdominal pain.  Spanish interpreter used for the entirety of the visit.  Patient states that she has some epigastric burning that seems to radiate to her left upper and left lower quadrant.  Initially she said it was not related to eating but then subsequently she states that whenever she ate something her took medicine and seem to get worse.  Not worse at night.  Some nausea no emesis.  No urinary changes.  A couple episodes of diarrhea over the last week that she has had the symptoms.  Also has not taken her blood pressure medication related to this symptom.  Has not noticed any hematuria no history of kidney stones.  No dysuria or any increased frequency of her urine either.  Her menstrual cycle finished on Wednesday.  Tried taking omeprazole but it made the burning worse so she stopped.  No alcohol or tobacco.   Abdominal Pain      Home Medications Prior to Admission medications   Medication Sig Start Date End Date Taking? Authorizing Provider  famotidine (PEPCID) 20 MG tablet Take 1 tablet (20 mg total) by mouth 2 (two) times daily. 04/07/23  Yes Zaniah Titterington, Barbara Cower, MD  pantoprazole (PROTONIX) 20 MG tablet Take 1 tablet (20 mg total) by mouth daily. 04/07/23  Yes Ilir Mahrt, Barbara Cower, MD  simethicone (MYLICON) 40 mg/0.22ml SUSP Take 0.6 mLs (40 mg total) by mouth 4 (four) times daily as needed for flatulence. 04/07/23  Yes Terah Robey, Barbara Cower, MD  sucralfate (CARAFATE) 1 GM/10ML suspension Take 10 mLs (1 g total) by mouth 4 (four) times daily -  with meals and at bedtime. 04/07/23  Yes Akeema Broder, Barbara Cower, MD      Allergies    Patient has no known allergies.    Review of Systems   Review of Systems   Gastrointestinal:  Positive for abdominal pain.    Physical Exam Updated Vital Signs BP 116/62 (BP Location: Right Arm)   Pulse 61   Temp 97.9 F (36.6 C) (Oral)   Resp 16   SpO2 99%  Physical Exam Vitals and nursing note reviewed.  Constitutional:      Appearance: She is well-developed.  HENT:     Head: Normocephalic and atraumatic.  Cardiovascular:     Rate and Rhythm: Normal rate and regular rhythm.  Pulmonary:     Effort: No respiratory distress.     Breath sounds: No stridor.  Abdominal:     General: There is no distension.     Tenderness: There is no abdominal tenderness.  Musculoskeletal:     Cervical back: Normal range of motion.  Neurological:     Mental Status: She is alert.     ED Results / Procedures / Treatments   Labs (all labs ordered are listed, but only abnormal results are displayed) Labs Reviewed  COMPREHENSIVE METABOLIC PANEL - Abnormal; Notable for the following components:      Result Value   Glucose, Bld 100 (*)    Calcium 8.6 (*)    All other components within normal limits  URINALYSIS, ROUTINE W REFLEX MICROSCOPIC - Abnormal; Notable for the following components:   Hgb urine dipstick MODERATE (*)    Leukocytes,Ua TRACE (*)  All other components within normal limits  LIPASE, BLOOD  CBC  HCG, SERUM, QUALITATIVE    EKG None  Radiology CT Renal Stone Study  Result Date: 04/07/2023 CLINICAL DATA:  Abdominal pain, flank pain, diarrhea EXAM: CT ABDOMEN AND PELVIS WITHOUT CONTRAST TECHNIQUE: Multidetector CT imaging of the abdomen and pelvis was performed following the standard protocol without IV contrast. RADIATION DOSE REDUCTION: This exam was performed according to the departmental dose-optimization program which includes automated exposure control, adjustment of the mA and/or kV according to patient size and/or use of iterative reconstruction technique. COMPARISON:  None Available. FINDINGS: Lower chest: No acute abnormality.  Hepatobiliary: No focal liver abnormality is seen. No gallstones, gallbladder wall thickening, or biliary dilatation. Pancreas: Unremarkable Spleen: Unremarkable Adrenals/Urinary Tract: Adrenal glands are unremarkable. Kidneys are normal, without renal calculi, focal lesion, or hydronephrosis. Bladder is unremarkable. Stomach/Bowel: Stomach is within normal limits. Appendix appears normal. No evidence of bowel wall thickening, distention, or inflammatory changes. Vascular/Lymphatic: No significant vascular findings are present. No enlarged abdominal or pelvic lymph nodes. Reproductive: Uterus and bilateral adnexa are unremarkable. Other: Tiny fat containing umbilical hernia Musculoskeletal: No acute or significant osseous findings. IMPRESSION: 1. No acute intra-abdominal pathology identified. No definite radiographic explanation for the patient's reported symptoms. Electronically Signed   By: Helyn Numbers M.D.   On: 04/07/2023 02:49    Procedures Procedures    Medications Ordered in ED Medications  pantoprazole (PROTONIX) EC tablet 40 mg (has no administration in time range)  alum & mag hydroxide-simeth (MAALOX/MYLANTA) 200-200-20 MG/5ML suspension 30 mL (30 mLs Oral Given 04/07/23 0220)    And  lidocaine (XYLOCAINE) 2 % viscous mouth solution 15 mL (15 mLs Oral Given 04/07/23 0221)  dicyclomine (BENTYL) capsule 10 mg (10 mg Oral Given 04/07/23 0221)  famotidine (PEPCID) tablet 20 mg (20 mg Oral Given 04/07/23 0221)    ED Course/ Medical Decision Making/ A&P                                 Medical Decision Making Amount and/or Complexity of Data Reviewed Labs: ordered. Radiology: ordered. ECG/medicine tests: ordered.  Risk OTC drugs. Prescription drug management.   Hematuria well after her period is finished we will get a stone study to evaluate for kidney stone although symptoms are very atypical for that.  Suspect that she likely has an ulcer or indigestion/heartburn gastritis.  Will  treat with a cocktail for those things pending her CT scan.  If her CT scans reassuring I think she can follow-up as an outpatient with a primary doctor to maybe change her medications around a little bit.  Patient with negative workup and significant improvement in symptoms at this time. Will d/c on similar medications with GI follow up as she doesn't have a PCP at this time.   Final Clinical Impression(s) / ED Diagnoses Final diagnoses:  Epigastric pain    Rx / DC Orders ED Discharge Orders          Ordered    sucralfate (CARAFATE) 1 GM/10ML suspension  3 times daily with meals & bedtime        04/07/23 0457    pantoprazole (PROTONIX) 20 MG tablet  Daily        04/07/23 0457    famotidine (PEPCID) 20 MG tablet  2 times daily        04/07/23 0457    simethicone (MYLICON) 40 mg/0.42ml SUSP  4 times daily  PRN        04/07/23 0457    Ambulatory referral to Gastroenterology        04/07/23 0457              Valisha Heslin, Barbara Cower, MD 04/07/23 [phone]

## 2023-04-07 NOTE — ED Notes (Signed)
Patient transported to CT 

## 2023-04-11 ENCOUNTER — Other Ambulatory Visit: Payer: Self-pay

## 2023-04-11 ENCOUNTER — Encounter: Payer: Self-pay | Admitting: Family Medicine

## 2023-04-11 ENCOUNTER — Ambulatory Visit: Payer: Self-pay | Attending: Family Medicine | Admitting: Family Medicine

## 2023-04-11 VITALS — BP 108/69 | HR 89 | Ht 59.0 in | Wt 121.2 lb

## 2023-04-11 DIAGNOSIS — R1013 Epigastric pain: Secondary | ICD-10-CM

## 2023-04-11 NOTE — Progress Notes (Signed)
Subjective:  Patient ID: Hayley Garcia, female    DOB: 06-27-1975  Age: 47 y.o. MRN: [id number]  CC: Abdominal Pain   HPI Hayley Garcia is a 47 y.o. year old female with a history of H. pylori gastritis (completed treatment) here for an office visit.  Interval History: Discussed the use of AI scribe software for clinical note transcription with the patient, who gave verbal consent to proceed.  She presents with ongoing abdominal pain and loss of appetite for the past week and had an ED visit 4 days ago.  Pantoprazole and COVID were prescribed but I am having a difficult time understanding if she is taking any medications as her med list reveals that this have not been dispensed from the pharmacy.  She is a difficult historian.  The pain is described as a heavy sensation on the left side of the stomach.  Notes in epic reveal her GI had tried to reach her 3 months ago to schedule a visit and were unsuccessful and they sent out a letter to her.  She also mentions transportation issues that have led to missed appointments.       Past Medical History:  Diagnosis Date   Anemia    hx of   Anxiety    not on meds at this time   Back pain    Chronic headaches    Continuous urine leakage    Cough    Excessive thirst    GERD (gastroesophageal reflux disease)    not on meds as of 06/17/2021   Hepatic steatosis    Menstrual pain    Night sweats    Pre-diabetes    Shortness of breath    Sore throat    Umbilical hernia    Vision changes     Past Surgical History:  Procedure Laterality Date   ANKLE SURGERY Left    pin    Family History  Problem Relation Age of Onset   Colon polyps Neg Hx    Colon cancer Neg Hx    Esophageal cancer Neg Hx    Rectal cancer Neg Hx    Stomach cancer Neg Hx     Social History   Socioeconomic History   Marital status: Single    Spouse name: Not on file   Number of children: 2   Years of education: Not on  file   Highest education level: Not on file  Occupational History   Occupation: Programmer, applications  Tobacco Use   Smoking status: Never   Smokeless tobacco: Never  Vaping Use   Vaping status: Never Used  Substance and Sexual Activity   Alcohol use: Not Currently   Drug use: Never   Sexual activity: Not Currently  Other Topics Concern   Not on file  Social History Narrative   Not on file   Social Determinants of Health   Financial Resource Strain: Not on file  Food Insecurity: Not on file  Transportation Needs: Not on file  Physical Activity: Not on file  Stress: Not on file  Social Connections: Not on file    No Known Allergies  Outpatient Medications Prior to Visit  Medication Sig Dispense Refill   albuterol (VENTOLIN HFA) 108 (90 Base) MCG/ACT inhaler Inhale 2 puffs into the lungs every 6 (six) hours as needed for wheezing or shortness of breath. 6.7 g 2   amitriptyline (ELAVIL) 10 MG tablet Take 1 tablet (10 mg total) by mouth at bedtime. 90 tablet 3  famotidine (PEPCID) 20 MG tablet Take 1 tablet (20 mg total) by mouth 2 (two) times daily. 10 tablet 0   metoCLOPramide (REGLAN) 10 MG tablet Take 1 tablet (10 mg total) by mouth every 6 (six) hours. 30 tablet 0   pantoprazole (PROTONIX) 20 MG tablet Take 1 tablet (20 mg total) by mouth daily. 14 tablet 0   simethicone (MYLICON) 40 mg/0.37ml SUSP Take 0.6 mLs (40 mg total) by mouth 4 (four) times daily as needed for flatulence. 45 mL 0   FLUoxetine (PROZAC) 20 MG capsule Take 1 capsule (20 mg total) by mouth daily. (Patient not taking: Reported on 06/09/2022) 30 capsule 3   pantoprazole (PROTONIX) 40 MG tablet Take 1 tablet (40 mg total) by mouth daily. 30 tablet 0   sucralfate (CARAFATE) 1 GM/10ML suspension Take 10 mLs (1 g total) by mouth 4 (four) times daily -  with meals and at bedtime. (Patient not taking: Reported on 04/11/2023) 420 mL 0   sucralfate (CARAFATE) 1 GM/10ML suspension Take 10 mLs (1 g total) by mouth 4 (four)  times daily -  with meals and at bedtime. (Patient not taking: Reported on 04/11/2023) 420 mL 0   No facility-administered medications prior to visit.     ROS Review of Systems  Constitutional:  Negative for activity change and appetite change.  HENT:  Negative for sinus pressure and sore throat.   Respiratory:  Negative for chest tightness, shortness of breath and wheezing.   Cardiovascular:  Negative for chest pain and palpitations.  Gastrointestinal:  Positive for abdominal pain. Negative for abdominal distention and constipation.  Genitourinary: Negative.   Musculoskeletal: Negative.   Psychiatric/Behavioral:  Negative for behavioral problems and dysphoric mood.     Objective:  BP 108/69   Pulse 89   Ht 4\' 11"  (1.499 m)   Wt 121 lb 3.2 oz (55 kg)   SpO2 99%   BMI 24.48 kg/m      04/11/2023    3:52 PM 04/07/2023    5:13 AM 04/07/2023    2:46 AM  BP/Weight  Systolic BP 108 116 110  Diastolic BP 69 62 65  Wt. (Lbs) 121.2    BMI 24.48 kg/m2        Physical Exam Vitals reviewed.  Constitutional:      Appearance: She is well-developed.  Cardiovascular:     Rate and Rhythm: Normal rate.     Heart sounds: Normal heart sounds. No murmur heard. Pulmonary:     Effort: Pulmonary effort is normal.     Breath sounds: Normal breath sounds. No wheezing or rales.  Chest:     Chest wall: No tenderness.  Abdominal:     General: Bowel sounds are normal. There is no distension.     Palpations: Abdomen is soft. There is no mass.     Tenderness: There is abdominal tenderness in the left upper quadrant.  Musculoskeletal:        General: Normal range of motion.     Right lower leg: No edema.     Left lower leg: No edema.  Neurological:     Mental Status: She is alert and oriented to person, place, and time.  Psychiatric:        Mood and Affect: Mood normal.        Latest Ref Rng & Units 04/06/2023   11:23 PM 12/31/2022   10:09 PM 11/13/2022   12:02 AM  CMP  Glucose 70 -  99 mg/dL 161  096  045  BUN 6 - 20 mg/dL 15  13  11    Creatinine 0.44 - 1.00 mg/dL 1.61  0.96  0.45   Sodium 135 - 145 mmol/L 137  134  135   Potassium 3.5 - 5.1 mmol/L 3.6  3.5  3.5   Chloride 98 - 111 mmol/L 104  102  103   CO2 22 - 32 mmol/L 24  22  23    Calcium 8.9 - 10.3 mg/dL 8.6  8.9  8.6   Total Protein 6.5 - 8.1 g/dL 7.0  7.6  7.4   Total Bilirubin <1.2 mg/dL 0.3  0.6  0.3   Alkaline Phos 38 - 126 U/L 78  75  72   AST 15 - 41 U/L 18  19  19    ALT 0 - 44 U/L 15  15  15      Lipid Panel     Component Value Date/Time   CHOL 180 03/17/2022 1019   TRIG 151 (H) 03/17/2022 1019   HDL 41 03/17/2022 1019   LDLCALC 112 (H) 03/17/2022 1019    CBC    Component Value Date/Time   WBC 9.0 04/06/2023 2323   RBC 4.80 04/06/2023 2323   HGB 13.2 04/06/2023 2323   HGB 13.9 03/17/2022 1019   HCT 42.2 04/06/2023 2323   HCT 43.6 03/17/2022 1019   PLT 286 04/06/2023 2323   PLT 324 03/17/2022 1019   MCV 87.9 04/06/2023 2323   MCV 87 03/17/2022 1019   MCH 27.5 04/06/2023 2323   MCHC 31.3 04/06/2023 2323   RDW 14.2 04/06/2023 2323   RDW 13.9 03/17/2022 1019   LYMPHSABS 1.3 05/28/2022 0535   LYMPHSABS 2.1 03/17/2022 1019   MONOABS 0.7 05/28/2022 0535   EOSABS 0.1 05/28/2022 0535   EOSABS 0.4 03/17/2022 1019   BASOSABS 0.0 05/28/2022 0535   BASOSABS 0.1 03/17/2022 1019    Lab Results  Component Value Date   HGBA1C 6.0 (H) 06/16/2022    Assessment & Plan:      Abdominal pain At this point it is unclear what medication she is taking Chronic abdominal pain with loss of appetite. History of Helicobacter pylori infection. Patient has seen a gastroenterologist and has undergone endoscopy in the past. Patient reports burning sensation in stomach with certain medications. -Refer back to gastroenterologist for further evaluation and management. -Provide patient with gastroenterologist's contact information. -She does not have her medications with her and is having a hard time  understanding instructions.  I would love to perform repeat H. pylori testing but cannot ascertain if she is on her PPI or not and the patient herself has not been helpful.  She would need to be off her PPI for 2 weeks so we can perform H. pylori testing but I will defer this to her gastroenterologist.  Medication Compliance -It is clear that she has been inconsistent with her medications -Encourage patient to take prescribed medications consistently       No orders of the defined types were placed in this encounter.   Follow-up: Return in about 6 months (around 10/09/2023) for Chronic medical conditions.       Hoy Register, MD, FAAFP. [facility] [place], [place] [phone]   04/11/2023, 4:23 PM

## 2023-04-11 NOTE — Patient Instructions (Addendum)
Phone: [phone]

## 2023-04-18 ENCOUNTER — Telehealth: Payer: Self-pay | Admitting: Gastroenterology

## 2023-04-18 NOTE — Telephone Encounter (Signed)
Pt having issues with epigastric pain. Saw her PCP recently and was also seen in the ER. Has hx of H pylori. Pt scheduled to see Hyacinth Meeker PA 04/26/23 at 9:30am.   Shanda Bumps please let pt know about the appt, she speaks spanish.

## 2023-04-18 NOTE — Telephone Encounter (Signed)
Patient called states she is having abdominal pain. Please advise.

## 2023-04-19 ENCOUNTER — Ambulatory Visit: Payer: Self-pay | Admitting: *Deleted

## 2023-04-19 NOTE — Telephone Encounter (Signed)
  Chief Complaint: Abdominal pain Symptoms: Pt seen 11/12 for symptoms, states worsening. Episode of vomiting this AM Frequency: Seen 04/11/23 Pertinent Negatives: Patient denies  Disposition: [] ED /[] Urgent Care (no appt availability in office) / [] Appointment(In office/virtual)/ []  [place] [facility]/ [] Home Care/ [] Refused Recommended Disposition /[] [place] [facility]/ [x]  Follow-up with PCP Additional Notes:   Pt evasive historian. States she has appt 05/02/23 with GI, would like H-Pylori test done prior. Advised referred to GI.States "I know it can be done in office."  Again advised deferred to GI. Pt also asking for a different anti-depressant (Prozac)  States "It gives me heartburn so I don't take it and the doctor got mad but I need something." Pt then stated she wanted a higher dose. Questioned if she was speaking of higher dose of Prozac which gives her heartburn and she does not take, pt replied "Yes."  Please advise. Assisted by Interpreter Vernona Rieger [phone] Reason for Disposition  Abdominal pain is a chronic symptom (recurrent or ongoing AND present > 4 weeks)  Answer Assessment - Initial Assessment Questions 1. LOCATION: "Where does it hurt?"      Entire abdomen, upper  2. RADIATION: "Does the pain shoot anywhere else?" (e.g., chest, back)     Entire abdomen 3. ONSET: "When did the pain begin?" (e.g., minutes, hours or days ago)      Seen OV 04/11/23 4. SUDDEN: "Gradual or sudden onset?"     NA 5. PATTERN "Does the pain come and go, or is it constant?"    - If it comes and goes: "How long does it last?" "Do you have pain now?"     (Note: Comes and goes means the pain is intermittent. It goes away completely between bouts.)    - If constant: "Is it getting better, staying the same, or getting worse?"      (Note: Constant means the pain never goes away completely; most serious pain is constant and gets worse.)      constant 6. SEVERITY: "How bad is the pain?"  (e.g.,  Scale 1-10; mild, moderate, or severe)    - MILD (1-3): Doesn't interfere with normal activities, abdomen soft and not tender to touch..     - MODERATE (4-7): Interferes with normal activities or awakens from sleep, abdomen tender to touch.     - SEVERE (8-10): Excruciating pain, doubled over, unable to do any normal activities.       unsure 7. RECURRENT SYMPTOM: "Have you ever had this type of stomach pain before?" If Yes, ask: "When was the last time?" and "What happened that time?"      yes 10. OTHER SYMPTOMS: "Do you have any other symptoms?" (e.g., back pain, diarrhea, fever, urination pain, vomiting)       Nausea, vomiting his AM  Protocols used: Abdominal Pain - Upper-A-AH

## 2023-04-20 NOTE — Telephone Encounter (Signed)
Left message on voicemail to return call by [facility].

## 2023-04-21 NOTE — Telephone Encounter (Signed)
Called but no answer. LVM to call back.  

## 2023-04-21 NOTE — Telephone Encounter (Signed)
I explained to her at her last office visit.  Please see the plan from my last note.  She needs to follow-up with her GI doctor and inform them her abdominal symptoms are not controlled.  We provided her with [facility] number, please provide this to her again as we have done what we can from a primary care standpoint.  She needs to take all her medications with her to that appointment.  Thank you

## 2023-04-24 ENCOUNTER — Other Ambulatory Visit: Payer: Self-pay

## 2023-04-24 NOTE — Telephone Encounter (Signed)
Called but no answer. LVM to call back.

## 2023-04-25 ENCOUNTER — Emergency Department (HOSPITAL_COMMUNITY)
Admission: EM | Admit: 2023-04-25 | Discharge: 2023-04-26 | Disposition: A | Discharge status: Home / Self Care | Payer: Self-pay | Attending: Emergency Medicine | Admitting: Emergency Medicine

## 2023-04-25 ENCOUNTER — Other Ambulatory Visit: Payer: Self-pay

## 2023-04-25 DIAGNOSIS — R1030 Lower abdominal pain, unspecified: Secondary | ICD-10-CM | POA: Insufficient documentation

## 2023-04-25 DIAGNOSIS — D72829 Elevated white blood cell count, unspecified: Secondary | ICD-10-CM | POA: Insufficient documentation

## 2023-04-25 LAB — HCG, SERUM, QUALITATIVE: Preg, Serum: NEGATIVE

## 2023-04-25 LAB — CBC
HCT: 42.4 % (ref 36.0–46.0)
Hemoglobin: 13.4 g/dL (ref 12.0–15.0)
MCH: 27 pg (ref 26.0–34.0)
MCHC: 31.6 g/dL (ref 30.0–36.0)
MCV: 85.3 fL (ref 80.0–100.0)
Platelets: 353 10*3/uL (ref 150–400)
RBC: 4.97 MIL/uL (ref 3.87–5.11)
RDW: 14.3 % (ref 11.5–15.5)
WBC: 13 10*3/uL — ABNORMAL HIGH (ref 4.0–10.5)
nRBC: 0 % (ref 0.0–0.2)

## 2023-04-25 LAB — URINALYSIS, ROUTINE W REFLEX MICROSCOPIC
Bilirubin Urine: NEGATIVE
Glucose, UA: NEGATIVE mg/dL
Ketones, ur: NEGATIVE mg/dL
Leukocytes,Ua: NEGATIVE
Nitrite: NEGATIVE
Protein, ur: NEGATIVE mg/dL
Specific Gravity, Urine: 1.004 — ABNORMAL LOW (ref 1.005–1.030)
pH: 7 (ref 5.0–8.0)

## 2023-04-25 LAB — COMPREHENSIVE METABOLIC PANEL
ALT: 15 U/L (ref 0–44)
AST: 19 U/L (ref 15–41)
Albumin: 3.8 g/dL (ref 3.5–5.0)
Alkaline Phosphatase: 70 U/L (ref 38–126)
Anion gap: 9 (ref 5–15)
BUN: 11 mg/dL (ref 6–20)
CO2: 22 mmol/L (ref 22–32)
Calcium: 9 mg/dL (ref 8.9–10.3)
Chloride: 105 mmol/L (ref 98–111)
Creatinine, Ser: 0.72 mg/dL (ref 0.44–1.00)
GFR, Estimated: >60 mL/min (ref >60)
Glucose, Bld: 153 mg/dL — ABNORMAL HIGH (ref 70–99)
Potassium: 3.5 mmol/L (ref 3.5–5.1)
Sodium: 136 mmol/L (ref 135–145)
Total Bilirubin: 0.5 mg/dL (ref <1.2)
Total Protein: 7.1 g/dL (ref 6.5–8.1)

## 2023-04-25 LAB — LIPASE, BLOOD: Lipase: 33 U/L (ref 11–51)

## 2023-04-25 NOTE — Telephone Encounter (Signed)
FYI Dr.Newlin. Third attempt. Called but no answer.  LVM to call back.

## 2023-04-25 NOTE — ED Triage Notes (Signed)
Patient reports persistent LLQ / upper abdominal pain for 1 week with occasional emesis and diarrhea , denies fever or chills.

## 2023-04-26 ENCOUNTER — Emergency Department (HOSPITAL_COMMUNITY): Payer: Self-pay

## 2023-04-26 ENCOUNTER — Ambulatory Visit: Payer: Self-pay | Admitting: Physician Assistant

## 2023-04-26 MED ORDER — ONDANSETRON HCL 4 MG/2ML IJ SOLN
4.0000 mg | Freq: Once | INTRAMUSCULAR | Status: AC
  Administered 2023-04-26: 4 mg via INTRAVENOUS
  Filled 2023-04-26: qty 2

## 2023-04-26 MED ORDER — FENTANYL CITRATE PF 50 MCG/ML IJ SOSY
50.0000 ug | PREFILLED_SYRINGE | Freq: Once | INTRAMUSCULAR | Status: AC
  Administered 2023-04-26: 50 ug via INTRAVENOUS
  Filled 2023-04-26: qty 1

## 2023-04-26 MED ORDER — IOHEXOL 350 MG/ML SOLN
60.0000 mL | Freq: Once | INTRAVENOUS | Status: AC | PRN
  Administered 2023-04-26: 60 mL via INTRAVENOUS

## 2023-04-26 MED ORDER — IOHEXOL 350 MG/ML SOLN: 75.0000 mL | Freq: Once | INTRAVENOUS | Status: DC | PRN

## 2023-04-26 NOTE — ED Provider Notes (Signed)
[facility] AT [facility] Provider Note   CSN: [id number] Arrival date & time: 04/25/23  2200     History  Chief Complaint  Patient presents with   Abdominal Pain    Hayley Garcia Deboraha Otwell is a 47 y.o. female.  The history is provided by the patient and medical records.  Abdominal Pain  46 y.o. F with hx of anemia, pre-diabetes, anxiety, chronic headaches, presenting to the ED for abdominal pain.  Has left lower and upper abdominal pain for the past week.  Some nausea, vomiting, and diarrhea.  No fever/chills.  No prior abdominal surgeries.  No intervention tried PTA.  Home Medications Prior to Admission medications   Medication Sig Start Date End Date Taking? Authorizing Provider  albuterol (VENTOLIN HFA) 108 (90 Base) MCG/ACT inhaler Inhale 2 puffs into the lungs every 6 (six) hours as needed for wheezing or shortness of breath. 11/22/22   Claiborne Rigg, NP  amitriptyline (ELAVIL) 10 MG tablet Take 1 tablet (10 mg total) by mouth at bedtime. 06/09/22   Jenel Lucks, MD  famotidine (PEPCID) 20 MG tablet Take 1 tablet (20 mg total) by mouth 2 (two) times daily. 04/07/23   Mesner, Barbara Cower, MD  FLUoxetine (PROZAC) 20 MG capsule Take 1 capsule (20 mg total) by mouth daily. Patient not taking: Reported on 06/09/2022 03/03/22   Hoy Register, MD  metoCLOPramide (REGLAN) 10 MG tablet Take 1 tablet (10 mg total) by mouth every 6 (six) hours. 11/13/22   Gailen Shelter, PA  pantoprazole (PROTONIX) 20 MG tablet Take 1 tablet (20 mg total) by mouth daily. 04/07/23   Mesner, Barbara Cower, MD  pantoprazole (PROTONIX) 40 MG tablet Take 1 tablet (40 mg total) by mouth daily. 01/01/23 01/31/23  Carroll Sage, PA-C  simethicone (MYLICON) 40 mg/0.56ml SUSP Take 0.6 mLs (40 mg total) by mouth 4 (four) times daily as needed for flatulence. 04/07/23   Mesner, Barbara Cower, MD  sucralfate (CARAFATE) 1 GM/10ML suspension Take 10 mLs (1 g total) by mouth 4 (four) times  daily -  with meals and at bedtime. Patient not taking: Reported on 04/11/2023 11/13/22   Gailen Shelter, PA  sucralfate (CARAFATE) 1 GM/10ML suspension Take 10 mLs (1 g total) by mouth 4 (four) times daily -  with meals and at bedtime. Patient not taking: Reported on 04/11/2023 04/07/23   Mesner, Barbara Cower, MD      Allergies    Patient has no known allergies.    Review of Systems   Review of Systems  Gastrointestinal:  Positive for abdominal pain.  All other systems reviewed and are negative.   Physical Exam Updated Vital Signs BP 112/70 (BP Location: Right Arm)   Pulse 80   Temp 98 F (36.7 C) (Oral)   Resp 16   SpO2 98%   Physical Exam Vitals and nursing note reviewed.  Constitutional:      Appearance: She is well-developed.  HENT:     Head: Normocephalic and atraumatic.  Eyes:     Conjunctiva/sclera: Conjunctivae normal.     Pupils: Pupils are equal, round, and reactive to light.  Cardiovascular:     Rate and Rhythm: Normal rate and regular rhythm.     Heart sounds: Normal heart sounds.  Pulmonary:     Effort: Pulmonary effort is normal.     Breath sounds: Normal breath sounds.  Abdominal:     General: Bowel sounds are normal.     Palpations: Abdomen is soft.  Tenderness: There is abdominal tenderness. There is no rebound.     Comments: Mild tenderness across lower abdomen  Musculoskeletal:        General: Normal range of motion.     Cervical back: Normal range of motion.  Skin:    General: Skin is warm and dry.  Neurological:     Mental Status: She is alert and oriented to person, place, and time.     ED Results / Procedures / Treatments   Labs (all labs ordered are listed, but only abnormal results are displayed) Labs Reviewed  COMPREHENSIVE METABOLIC PANEL - Abnormal; Notable for the following components:      Result Value   Glucose, Bld 153 (*)    All other components within normal limits  CBC - Abnormal; Notable for the following components:    WBC 13.0 (*)    All other components within normal limits  URINALYSIS, ROUTINE W REFLEX MICROSCOPIC - Abnormal; Notable for the following components:   Color, Urine STRAW (*)    Specific Gravity, Urine 1.004 (*)    Hgb urine dipstick SMALL (*)    Bacteria, UA RARE (*)    All other components within normal limits  LIPASE, BLOOD  HCG, SERUM, QUALITATIVE    EKG None  Radiology No results found.  Procedures Procedures    Medications Ordered in ED Medications - No data to display  ED Course/ Medical Decision Making/ A&P                                 Medical Decision Making Amount and/or Complexity of Data Reviewed Labs: ordered. Radiology: ordered and independent interpretation performed. ECG/medicine tests: ordered and independent interpretation performed.   47 y.o. F here with lower abdominal pain x1 week.  Some associated nausea/vomiting/diarrhea.  She is afebrile and nontoxic.  Does have some mild tenderness across lower abdomen.  Does have mild leukocytosis at 13,000, no electrolyte derangement.  UA with rare bacteria.  Will obtain CT for further evaluation.  6:39 AM CT not done overnight.  Care will be signed out to oncoming provider to follow-up on results.   Final Clinical Impression(s) / ED Diagnoses Final diagnoses:  Lower abdominal pain    Rx / DC Orders ED Discharge Orders     None         Garlon Hatchet, PA-C 04/26/23 [id number]    Glynn Octave, MD 04/26/23 [phone]

## 2023-04-26 NOTE — ED Provider Notes (Signed)
Patient's care assumed by me at 6:30 AM from previous provider.  Patient is pending CT scan of her abdomen.  Patient has a past medical history of abdominal pain.  Patient is currently complaining of left-sided abdominal pain. CT scan returned CT scan shows no acute abnormality.  I spoke with patient using the language line.  Patient is scheduled to see her GI doctor December 3.  Her primary care doctor's notes reports that she is currently not taking any PPI or medications in order that she can be retested for H. pylori he has she has had this in the past.  I counseled patient on symptomatic relief.  Patient is advised to keep the follow-up appointment with her GI doctor.  She is discharged in stable condition.   Elson Areas, [place] 04/26/23 1027    Vanetta Mulders, MD 04/27/23 [phone]

## 2023-04-26 NOTE — Discharge Instructions (Addendum)
Follow up with your Gi doctor as scheduled

## 2023-04-26 NOTE — Telephone Encounter (Signed)
Inbound call from patient, requesting to reschedule her appointment scheduled for 12/3. Patient is requesting for it to be on 12/4. Advised patient no appointments were available until February, Patient requesting to speak to a nurse for sooner appointment.

## 2023-04-26 NOTE — Telephone Encounter (Signed)
Pts appt moved to 05/08/23 at 1:30pm with Quentin Mulling PA. Pt aware.

## 2023-05-02 ENCOUNTER — Ambulatory Visit: Payer: Self-pay | Admitting: Nurse Practitioner

## 2023-05-02 NOTE — Telephone Encounter (Signed)
Patient called, requesting for appointment to be moved to Wednesday. She states she does not have access to transportation now on any other day. Patient does not wish to be scheduled for first available in February.

## 2023-05-03 NOTE — Telephone Encounter (Signed)
Pts appt moved to 05/10/23 at 11:30am with Willette Cluster NP. Please let pt know about the appt.

## 2023-05-08 ENCOUNTER — Ambulatory Visit: Payer: Self-pay | Admitting: Physician Assistant

## 2023-05-10 ENCOUNTER — Ambulatory Visit (INDEPENDENT_AMBULATORY_CARE_PROVIDER_SITE_OTHER): Payer: Self-pay | Admitting: Nurse Practitioner

## 2023-05-10 ENCOUNTER — Other Ambulatory Visit: Payer: Self-pay

## 2023-05-10 ENCOUNTER — Encounter: Payer: Self-pay | Admitting: Nurse Practitioner

## 2023-05-10 ENCOUNTER — Ambulatory Visit: Payer: Self-pay

## 2023-05-10 VITALS — BP 100/68 | HR 82 | Ht 59.0 in | Wt 122.6 lb

## 2023-05-10 DIAGNOSIS — K219 Gastro-esophageal reflux disease without esophagitis: Secondary | ICD-10-CM

## 2023-05-10 DIAGNOSIS — G8929 Other chronic pain: Secondary | ICD-10-CM

## 2023-05-10 DIAGNOSIS — R197 Diarrhea, unspecified: Secondary | ICD-10-CM

## 2023-05-10 DIAGNOSIS — R1084 Generalized abdominal pain: Secondary | ICD-10-CM

## 2023-05-10 MED ORDER — SUCRALFATE 1 GM/10ML PO SUSP
1.0000 g | Freq: Two times a day (BID) | ORAL | 1 refills | Status: DC
  Filled 2023-05-10: qty 473, 24d supply, fill #0
  Filled 2023-05-10: qty 414, fill #0

## 2023-05-10 MED ORDER — AMITRIPTYLINE HCL 10 MG PO TABS
20.0000 mg | ORAL_TABLET | Freq: Every day | ORAL | 5 refills | Status: DC
  Filled 2023-05-10: qty 60, 30d supply, fill #0

## 2023-05-10 MED ORDER — FAMOTIDINE 20 MG PO TABS
20.0000 mg | ORAL_TABLET | Freq: Two times a day (BID) | ORAL | 5 refills | Status: DC
  Filled 2023-05-10: qty 60, 30d supply, fill #0
  Filled 2023-06-14: qty 60, 30d supply, fill #1
  Filled 2023-08-02: qty 60, 30d supply, fill #2

## 2023-05-10 MED ORDER — FAMOTIDINE 20 MG PO TABS: 20.0000 mg | ORAL_TABLET | Freq: Two times a day (BID) | ORAL | 5 refills | Status: DC

## 2023-05-10 NOTE — Progress Notes (Signed)
ASSESSMENT    Brief Narrative:  47 y.o.  female known to Dr. Tomasa Rand with a past medical history not limited to anxiety, chronic generalized abdominal pain, hepatic steatosis  Chronic generalized abdominal pain felt to be functional . Negative extensive workup including EGD, colonoscopy, repeat US, multiple CT scan ( including 3 more since we saw her in January 2024).   Acute diarrhea.  She is concerned about E.coli infection (says she has had it before).  Enteric infection  seems unlikely but she is concerned and wants stool testing.   See [facility] for any additional medical & surgical history   PLAN   --Carafate suspension 1 gram BID, once in am an hour prior to taking any other medications and once at bedtime x 14 days --Resume Pepcid 20 mg BID # 60 x 5 refills.  --Increase Elavil to 20 mg at bedtime # 30 with 5 refills.  --Check stool path panel.   HPI   Chief complaint : abdominal pain , diarrhea, feels depressed  Here with a Spanish interpreter  Brief GI History:  Patient was last seen here by Dr. Tomasa Rand in January 2024 for abdominal pain, recurrent vomiting.  She had a CT AP the month prior without any acute findings.  She has had multiple negative ultrasounds, CT scans, EGD, colonoscopy and laboratory evaluation.  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  Because she described hematemesis, she was scheduled for an EGD.  For abdominal pain she was started on TCA.  Her EGD was negative   Interval History:  Hayley Garcia tells me she feels depressed.  She has been having heartburn.  Not taking pantoprazole because it makes the burning worse.  Has no refills of stool Carafate but it previously helped.  She has generalized abdominal pain which is nearly constant.  This pain is the same as for which she has been previously evaluated here.  Over the last week she has been having loose stool 3-4 times a day.  No blood in stool.  No recent antibiotic use.  She is worried  about an E. coli infection.  She continues to take 10 mg of Elavil daily and would like to increase the dose  Procedure risk assessment:  No history of CHF.  No supplemental 02 use at home.  Not a known difficult airway Anticoagulant:     GI History / Pertinent GI Studies   **All endoscopic studies may not be included here    EGD 07/28/22 for hematemesis Normal esophagus.  Normal duodenum.  Normal stomach.  Biopsied..  Bleed felt to be secondary to a pharyngeal source or Mallory-Weiss tear which is subsequently healed.   Colonoscopy February 2023 -Normal colon/terminal ileum.  Repeat in 10 years  CT abd/pelvis May 28, 2022 IMPRESSION: No acute findings or other significant abnormality within the abdomen or pelvis.       Latest Ref Rng & Units 04/25/2023   10:19 PM 04/06/2023   11:23 PM 12/31/2022   10:09 PM  Hepatic Function  Total Protein 6.5 - 8.1 g/dL 7.1  7.0  7.6   Albumin 3.5 - 5.0 g/dL 3.8  3.5  3.8   AST 15 - 41 U/L 19  18  19    ALT 0 - 44 U/L 15  15  15    Alk Phosphatase 38 - 126 U/L 70  78  75   Total Bilirubin <1.2 mg/dL 0.5  0.3  0.6        Latest  Ref Rng & Units 04/25/2023   10:19 PM 04/06/2023   11:23 PM 12/31/2022   10:09 PM  CBC  WBC 4.0 - 10.5 K/uL 13.0  9.0  10.8   Hemoglobin 12.0 - 15.0 g/dL 40.9  81.1  91.4   Hematocrit 36.0 - 46.0 % 42.4  42.2  44.0   Platelets 150 - 400 K/uL 353  286  311      Past Medical History:  Diagnosis Date   Anemia    hx of   Anxiety    not on meds at this time   Back pain    Chronic headaches    Continuous urine leakage    Cough    Excessive thirst    GERD (gastroesophageal reflux disease)    not on meds as of 06/17/2021   Hepatic steatosis    Menstrual pain    Night sweats    Pre-diabetes    Shortness of breath    Sore throat    Umbilical hernia    Vision changes     Past Surgical History:  Procedure Laterality Date   ANKLE SURGERY Left    pin    Family History  Problem Relation Age of Onset    Colon polyps Neg Hx    Colon cancer Neg Hx    Esophageal cancer Neg Hx    Rectal cancer Neg Hx    Stomach cancer Neg Hx     Current Medications, Allergies, Family History and Social History were reviewed in [employer] record.     Current Outpatient Medications  Medication Sig Dispense Refill   albuterol (VENTOLIN HFA) 108 (90 Base) MCG/ACT inhaler Inhale 2 puffs into the lungs every 6 (six) hours as needed for wheezing or shortness of breath. 6.7 g 2   amitriptyline (ELAVIL) 10 MG tablet Take 1 tablet (10 mg total) by mouth at bedtime. 90 tablet 3   famotidine (PEPCID) 20 MG tablet Take 1 tablet (20 mg total) by mouth 2 (two) times daily. (Patient not taking: Reported on 04/26/2023) 10 tablet 0   simethicone (MYLICON) 40 mg/0.22ml SUSP Take 0.6 mLs (40 mg total) by mouth 4 (four) times daily as needed for flatulence. (Patient not taking: Reported on 04/26/2023) 45 mL 0   No current facility-administered medications for this visit.    Review of Systems: No chest pain. No shortness of breath. No urinary complaints.    Physical Exam  Filed Weights   05/10/23 1139  Weight: 122 lb 9.6 oz (55.6 kg)   Wt Readings from Last 3 Encounters:  05/10/23 122 lb 9.6 oz (55.6 kg)  04/26/23 120 lb (54.4 kg)  04/11/23 121 lb 3.2 oz (55 kg)    BP 100/68   Pulse 82   Ht 4\' 11"  (1.499 m)   Wt 122 lb 9.6 oz (55.6 kg)   LMP 04/28/2023 (Approximate)   BMI 24.76 kg/m  Constitutional:  Pleasant, generally well appearing female in no acute distress. Psychiatric: Normal mood and affect. Behavior is normal. EENT: Pupils normal.  Conjunctivae are normal. No scleral icterus. Neck supple.  Cardiovascular: Normal rate, regular rhythm.  Pulmonary/chest: Effort normal and breath sounds normal. No wheezing, rales or rhonchi. Abdominal: Soft, nondistended, nontender. Bowel sounds active throughout. There are no masses palpable. No hepatomegaly. Neurological: Alert and oriented  to person place and time.    Hayley Cluster, NP  05/10/2023, 11:49 AM  Cc:  Hoy Register, MD

## 2023-05-10 NOTE — Patient Instructions (Signed)
We have sent the following medications to your pharmacy for you to pick up at your convenience:  Pepcid,  Carafate, Elavil.  Your provider has requested that you go to the basement level for lab work before leaving today. Press "B" on the elevator. The lab is located at the first door on the left as you exit the elevator.

## 2023-05-13 LAB — GI PROFILE, STOOL, PCR

## 2023-05-16 NOTE — Progress Notes (Signed)
Agree with the assessment and plan as outlined by Willette Cluster, NP.  Patient's abdominal pain is not from a primary GI etiology, but a rather a gut-brain axis disorder (IBS) which is negatively impacted by undertreated mood disorders such as depression and anxiety.  Agree with increasing TCA dose and recommend she follow up with behavioral health to more specifically address depression.  If tolerating 20 mg dose, would increase to 50 mg in one month, assuming no additional medical therapy planned for depression (ie SSRI)  Neka Bise E. Tomasa Rand, MD [facility]

## 2023-05-19 ENCOUNTER — Other Ambulatory Visit: Payer: Self-pay

## 2023-05-19 ENCOUNTER — Telehealth: Payer: Self-pay | Admitting: *Deleted

## 2023-05-19 ENCOUNTER — Other Ambulatory Visit: Payer: Self-pay | Admitting: *Deleted

## 2023-05-19 DIAGNOSIS — R1013 Epigastric pain: Secondary | ICD-10-CM

## 2023-05-19 DIAGNOSIS — F321 Major depressive disorder, single episode, moderate: Secondary | ICD-10-CM

## 2023-05-19 MED ORDER — AMITRIPTYLINE HCL 25 MG PO TABS
50.0000 mg | ORAL_TABLET | Freq: Every day | ORAL | 0 refills | Status: DC
  Filled 2023-05-19: qty 60, 20d supply, fill #0

## 2023-05-19 NOTE — Telephone Encounter (Signed)
Called patient with the assistance of a Spanish speaking interpreter to inform the patient that Ms. Wilmon Pali, NP at [facility]  increased the Elavil medication last week from 10 mg to 20 mg daily and if she is tolerating let's increase again to 50 mg nightly. If at any point someone starts her on psychiatric medications, she needs to make them aware she is taking Elavil. Patient should f/u with an appt., will contact to schedule appt when the new schedule is available. Patient not available, message left via the interpreter.

## 2023-05-19 NOTE — Telephone Encounter (Signed)
-----   Message from Willette Cluster sent at 05/18/2023  1:59 PM EST ----- Alona Bene,  Will you contact patient . I increased Elavil from 10 mg daily to 20 mg daily last week. If tolerating then let's increase again in two weeks to 50 mg nightly.  Is at any point someone starts her on SSRI or other psychiatric medications then she needs to make then aware that she is taking Elavil. She should follow up in 3-4 if doesn't already have an appt. Thanks

## 2023-05-22 ENCOUNTER — Other Ambulatory Visit: Payer: Self-pay

## 2023-05-29 ENCOUNTER — Encounter: Payer: Self-pay | Admitting: *Deleted

## 2023-05-29 ENCOUNTER — Telehealth: Payer: Self-pay | Admitting: *Deleted

## 2023-05-29 NOTE — Telephone Encounter (Signed)
Unable to contact patient regarding medication increase for Elavil as well as informing the patient she has a negative stool pathogen and there is no infection. Sent letter to patient regarding both result for the stool pathogen being negative and the increase in the medication Elavil. Used the assistance of the Spainish interpreter and left messages when feasible. Have not been able to leave messages in most instances due to the VM being full. Sent letters to the patient instead,

## 2023-05-29 NOTE — Telephone Encounter (Signed)
-----   Message from Willette Cluster sent at 05/18/2023  1:59 PM EST ----- Alona Bene,  Will you contact patient . I increased Elavil from 10 mg daily to 20 mg daily last week. If tolerating then let's increase again in two weeks to 50 mg nightly.  Is at any point someone starts her on SSRI or other psychiatric medications then she needs to make then aware that she is taking Elavil. She should follow up in 3-4 if doesn't already have an appt. Thanks

## 2023-06-05 ENCOUNTER — Ambulatory Visit: Payer: Self-pay | Admitting: Nurse Practitioner

## 2023-06-07 ENCOUNTER — Telehealth: Payer: Self-pay | Admitting: *Deleted

## 2023-06-07 ENCOUNTER — Encounter: Payer: Self-pay | Admitting: *Deleted

## 2023-06-07 NOTE — Telephone Encounter (Signed)
-----   Message from Vina Dasen sent at 06/05/2023  4:48 PM EST ----- She is Dr. London patient so would try to get in with him. It is not urgent so can wait until March if he has something then. 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 Thanks ----- Message ----- From: Eloisa Rung, RN Sent: 06/05/2023   1:16 PM EST To: Vina CHRISTELLA Dasen, NP  Do you want to have someone else see this patient? No noted schedule after Feb. 18th. ----- Message ----- From: Eloisa Rung, RN Sent: 05/19/2023  10:50 AM EST To: Rung Eloisa, RN  Schedule appt for patient with Vina in 3-4 months for follow up. Increased Elavil  medication.

## 2023-06-07 NOTE — Telephone Encounter (Signed)
 Called patient with the use of an interpreter to make an appt with  Dr. Stacia for f/u visit. No answer, left SMS notification with noted phone number calling from [phone]. Unable to leave message due to mail box being full. Sent letter to patient's address with F/U app. Appointment scheduled for 08/17/2023. Have sent previous messages without any return calls.

## 2023-06-14 ENCOUNTER — Other Ambulatory Visit: Payer: Self-pay

## 2023-06-14 NOTE — Telephone Encounter (Signed)
 Patient called requesting a call back for her lab results

## 2023-06-14 NOTE — Telephone Encounter (Signed)
 Patient called to find out about stool pathogen results. Informed patient the results were negative and there were not signs of infection.

## 2023-06-25 IMAGING — US US ABDOMEN LIMITED
1 series · 14 of 25 positions shown · non-contrast
Comparison: February 20, 2021

CLINICAL DATA: Right upper quadrant pain.

EXAM:
ULTRASOUND ABDOMEN LIMITED RIGHT UPPER QUADRANT

[Series 1: us abdomen limited · 14 of 46 slices shown]
[im 1/46]
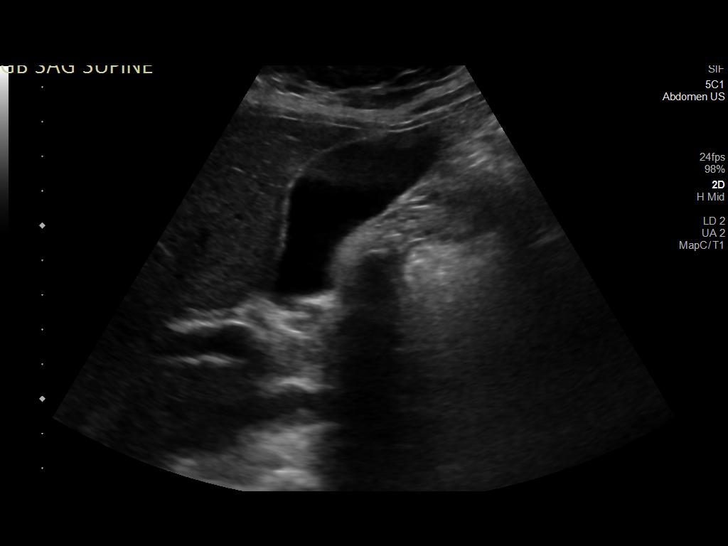
[im 4/46]
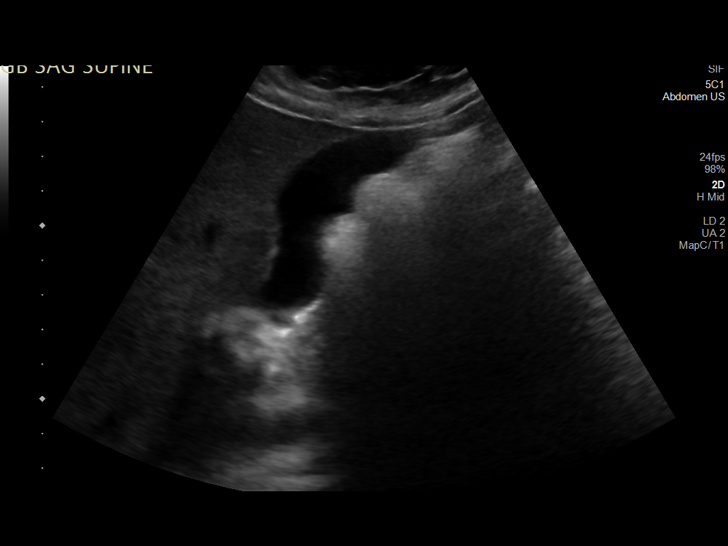
[im 8/46]
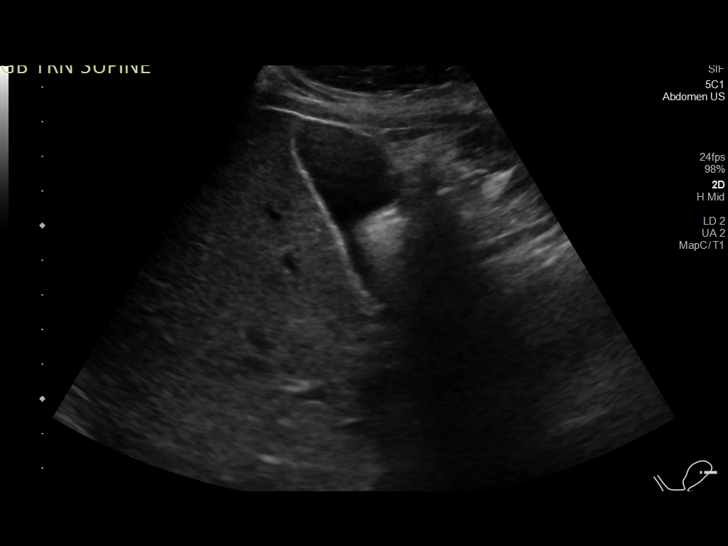
[im 12/46]
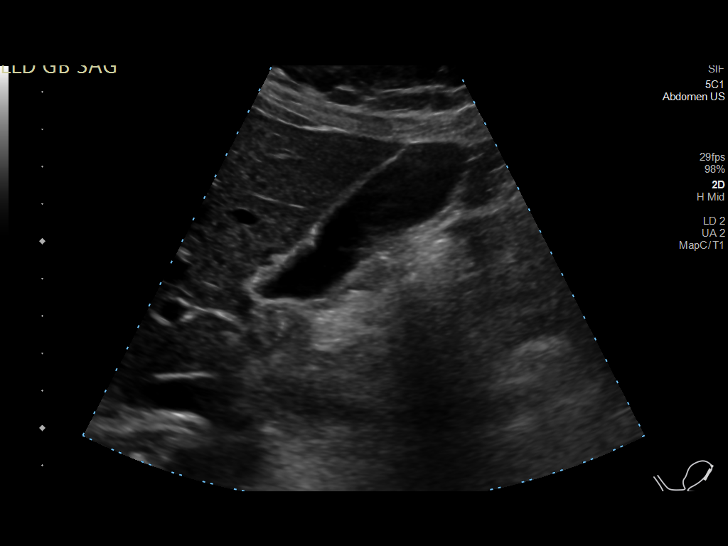
[im 16/46]
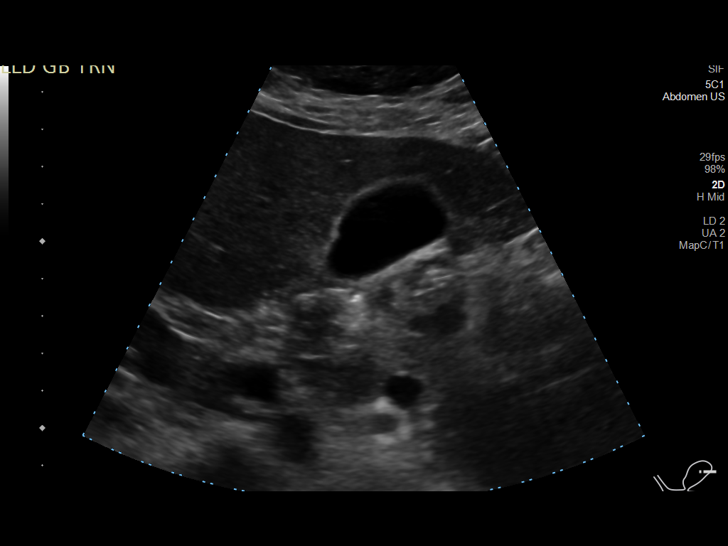
[im 17/46]
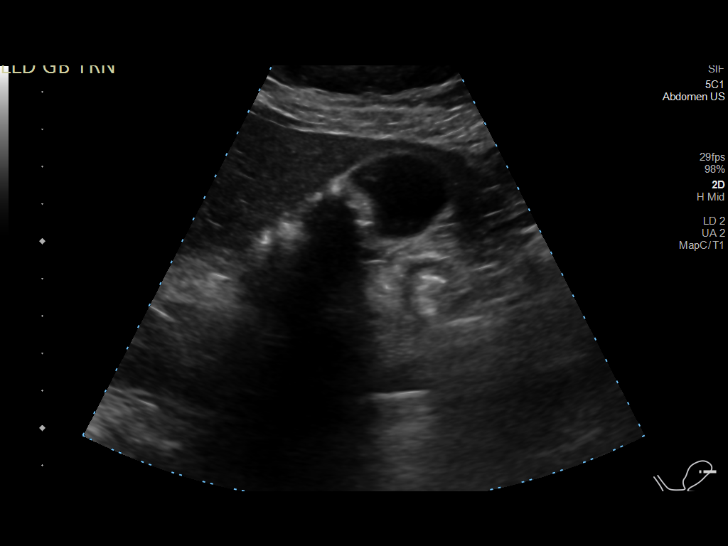
[im 21/46]
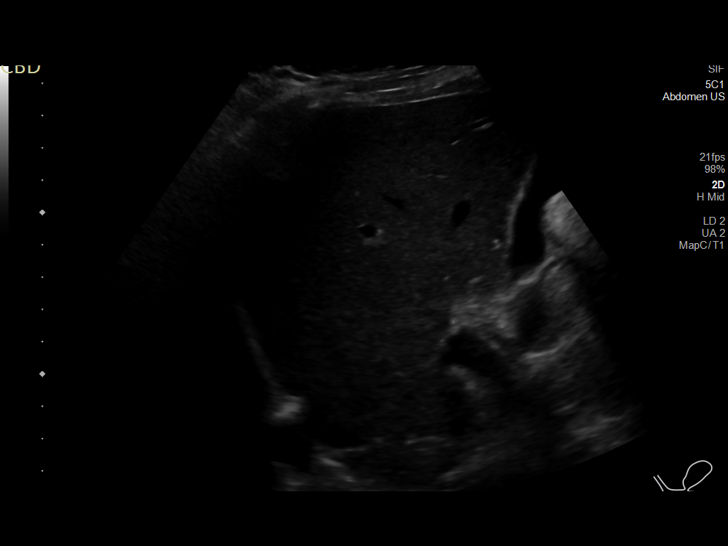
[im 25/46]
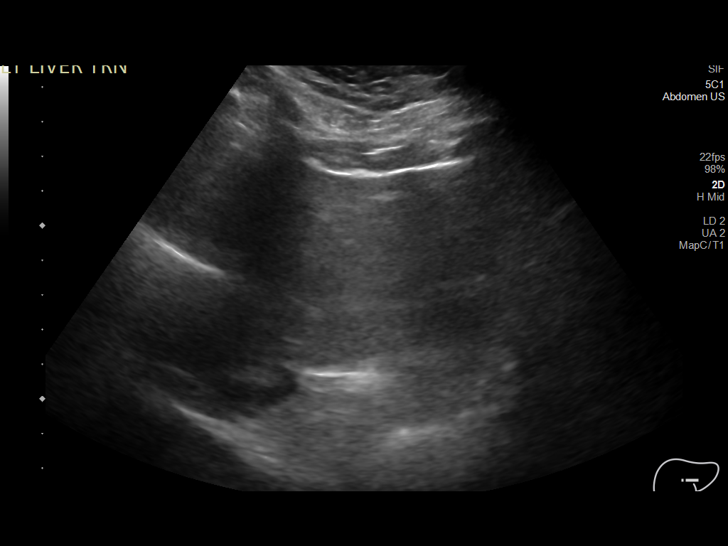
[im 29/46]
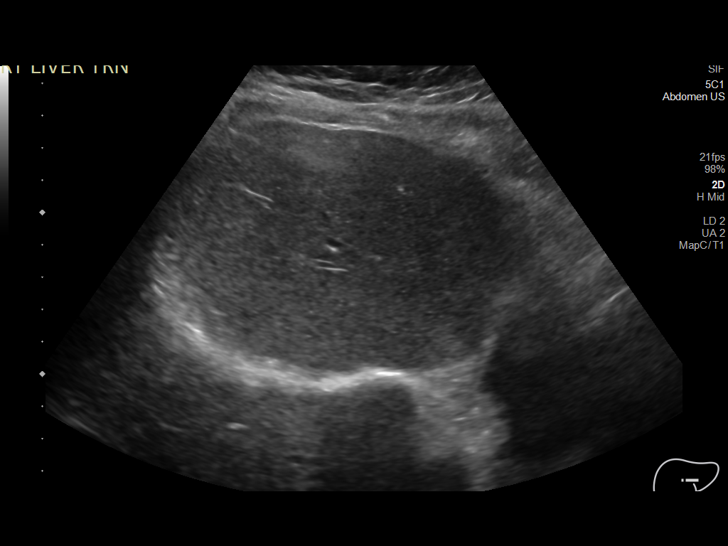
[im 31/46]
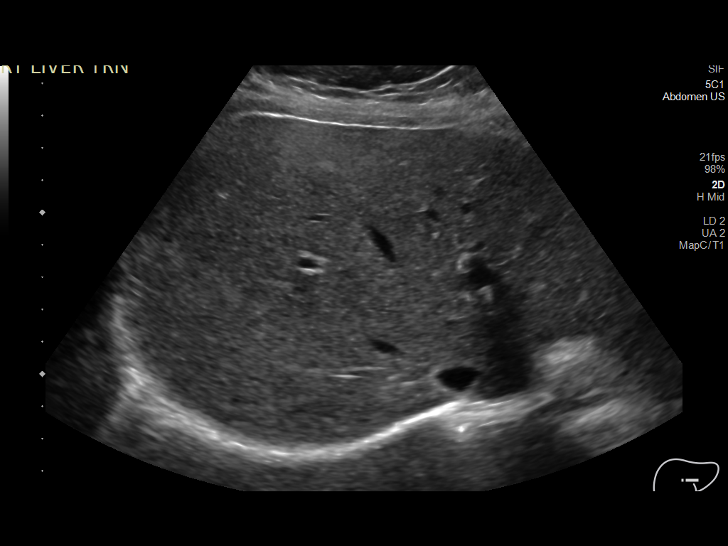
[im 34/46]
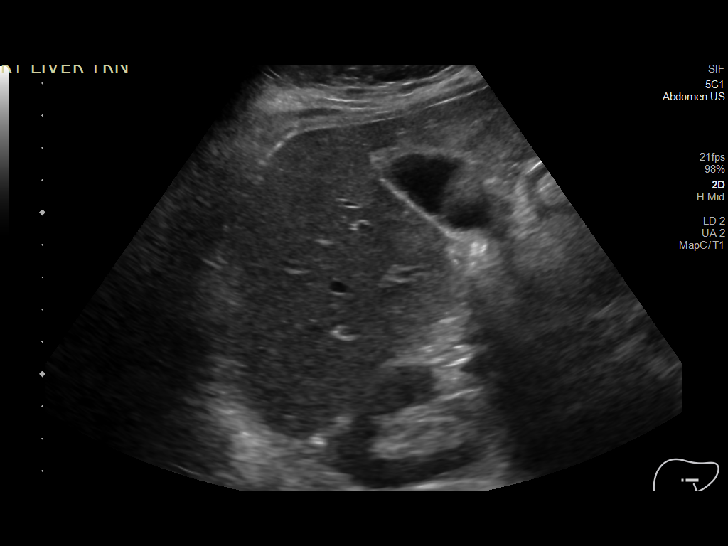
[im 38/46]
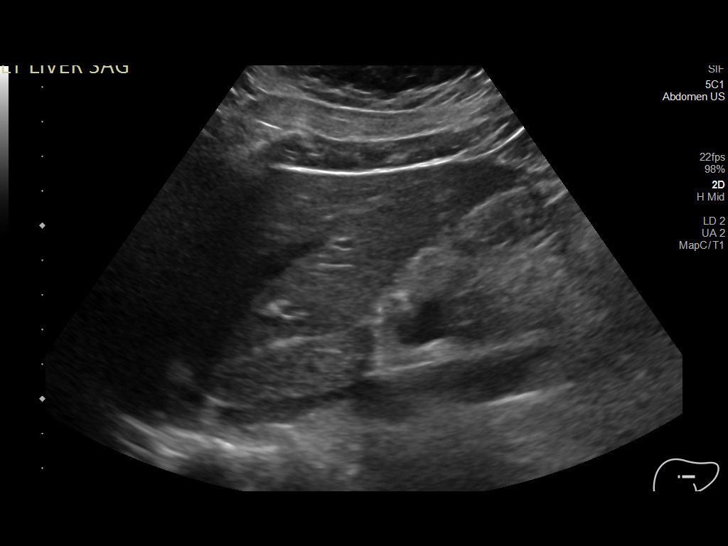
[im 42/46]
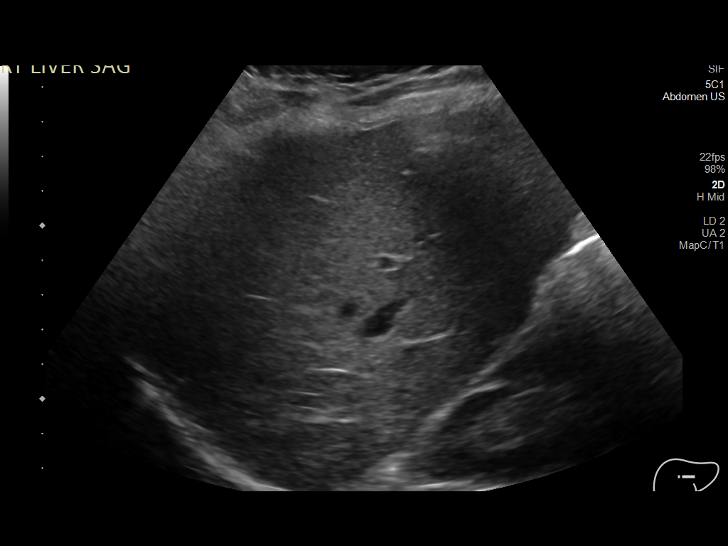
[im 46/46]
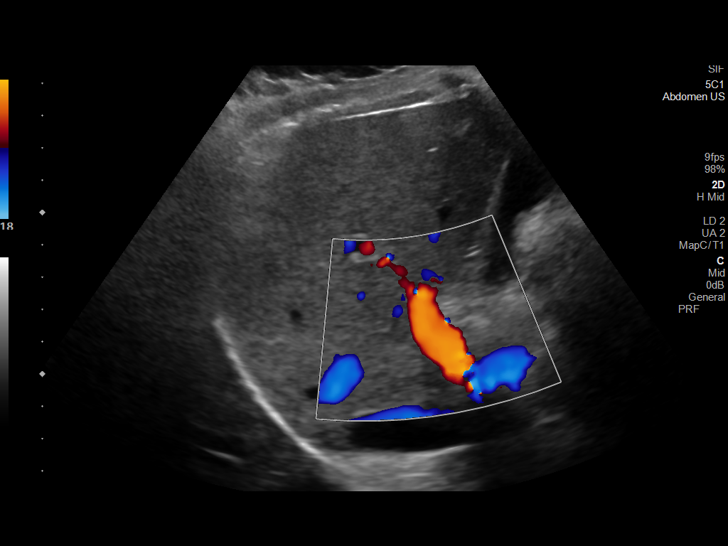

[14 of 25 positions shown; findings below may reference images not displayed]

FINDINGS: Gallbladder:

No gallstones or wall thickening visualized (2.3 mm). No sonographic
Murphy sign noted by sonographer.

Common bile duct:

Diameter: 3.5 mm

Liver:

No focal lesion identified. Diffusely increased echogenicity of the
liver parenchyma is noted. Portal vein is patent on color Doppler
imaging with normal direction of blood flow towards the liver.

Other: None.
IMPRESSION: Hepatic steatosis without focal liver lesions.

## 2023-08-02 ENCOUNTER — Other Ambulatory Visit: Payer: Self-pay | Admitting: Nurse Practitioner

## 2023-08-02 ENCOUNTER — Telehealth: Payer: Self-pay | Admitting: Nurse Practitioner

## 2023-08-02 ENCOUNTER — Other Ambulatory Visit: Payer: Self-pay

## 2023-08-02 DIAGNOSIS — F321 Major depressive disorder, single episode, moderate: Secondary | ICD-10-CM

## 2023-08-02 DIAGNOSIS — R1013 Epigastric pain: Secondary | ICD-10-CM

## 2023-08-02 MED ORDER — AMITRIPTYLINE HCL 25 MG PO TABS
50.0000 mg | ORAL_TABLET | Freq: Every day | ORAL | 0 refills | Status: DC
  Filled 2023-08-02: qty 60, 30d supply, fill #0

## 2023-08-02 NOTE — Telephone Encounter (Signed)
 Elavil 50 mg refilled for 1 month. Pt is scheduled to see Dr. Tomasa Rand on 08/17/23.

## 2023-08-02 NOTE — Telephone Encounter (Signed)
 Inbound call from patient, would like refill of depression medication called into the [facility] pharmacy.

## 2023-08-04 ENCOUNTER — Telehealth: Payer: Self-pay | Admitting: Nurse Practitioner

## 2023-08-04 ENCOUNTER — Other Ambulatory Visit: Payer: Self-pay

## 2023-08-04 NOTE — Telephone Encounter (Signed)
 Patient requesting a refill on Elavil.

## 2023-08-04 NOTE — Telephone Encounter (Signed)
 Called patient to advise left voicemail.

## 2023-08-17 ENCOUNTER — Ambulatory Visit: Payer: Self-pay | Admitting: Gastroenterology

## 2023-09-25 ENCOUNTER — Other Ambulatory Visit: Payer: Self-pay

## 2023-09-25 ENCOUNTER — Emergency Department (HOSPITAL_COMMUNITY)
Admission: EM | Admit: 2023-09-25 | Discharge: 2023-09-26 | Disposition: A | Discharge status: Home / Self Care | Payer: Self-pay | Attending: Emergency Medicine | Admitting: Emergency Medicine

## 2023-09-25 ENCOUNTER — Encounter (HOSPITAL_COMMUNITY): Payer: Self-pay

## 2023-09-25 DIAGNOSIS — R109 Unspecified abdominal pain: Secondary | ICD-10-CM

## 2023-09-25 DIAGNOSIS — R1012 Left upper quadrant pain: Secondary | ICD-10-CM | POA: Insufficient documentation

## 2023-09-25 DIAGNOSIS — R1011 Right upper quadrant pain: Secondary | ICD-10-CM | POA: Insufficient documentation

## 2023-09-25 DIAGNOSIS — R110 Nausea: Secondary | ICD-10-CM | POA: Insufficient documentation

## 2023-09-25 LAB — COMPREHENSIVE METABOLIC PANEL WITH GFR
ALT: 13 U/L (ref 0–44)
AST: 15 U/L (ref 15–41)
Albumin: 3.7 g/dL (ref 3.5–5.0)
Alkaline Phosphatase: 65 U/L (ref 38–126)
Anion gap: 11 (ref 5–15)
BUN: 19 mg/dL (ref 6–20)
CO2: 21 mmol/L — ABNORMAL LOW (ref 22–32)
Calcium: 8.5 mg/dL — ABNORMAL LOW (ref 8.9–10.3)
Chloride: 104 mmol/L (ref 98–111)
Creatinine, Ser: 0.56 mg/dL (ref 0.44–1.00)
GFR, Estimated: >60 mL/min (ref >60)
Glucose, Bld: 98 mg/dL (ref 70–99)
Potassium: 3.9 mmol/L (ref 3.5–5.1)
Sodium: 136 mmol/L (ref 135–145)
Total Bilirubin: 0.3 mg/dL (ref 0.0–1.2)
Total Protein: 7.1 g/dL (ref 6.5–8.1)

## 2023-09-25 LAB — CBC
HCT: 42.2 % (ref 36.0–46.0)
Hemoglobin: 13.5 g/dL (ref 12.0–15.0)
MCH: 28.7 pg (ref 26.0–34.0)
MCHC: 32 g/dL (ref 30.0–36.0)
MCV: 89.8 fL (ref 80.0–100.0)
Platelets: 305 10*3/uL (ref 150–400)
RBC: 4.7 MIL/uL (ref 3.87–5.11)
RDW: 14.5 % (ref 11.5–15.5)
WBC: 11.2 10*3/uL — ABNORMAL HIGH (ref 4.0–10.5)
nRBC: 0 % (ref 0.0–0.2)

## 2023-09-25 LAB — URINALYSIS, ROUTINE W REFLEX MICROSCOPIC
Bacteria, UA: NONE SEEN
Bilirubin Urine: NEGATIVE
Glucose, UA: NEGATIVE mg/dL
Ketones, ur: NEGATIVE mg/dL
Leukocytes,Ua: NEGATIVE
Nitrite: NEGATIVE
Protein, ur: NEGATIVE mg/dL
Specific Gravity, Urine: 1.014 (ref 1.005–1.030)
pH: 6 (ref 5.0–8.0)

## 2023-09-25 LAB — HCG, SERUM, QUALITATIVE: Preg, Serum: NEGATIVE

## 2023-09-25 LAB — LIPASE, BLOOD: Lipase: 34 U/L (ref 11–51)

## 2023-09-25 NOTE — ED Triage Notes (Addendum)
 Spanish interpreter used for triage [phone]:   Pt c.o RUQ pain and LLQ pain x 1 week with a fullness sensation. Some nausea Pt also c.o black colored stool when she wipes.

## 2023-09-26 ENCOUNTER — Emergency Department (HOSPITAL_COMMUNITY): Payer: Self-pay

## 2023-09-26 MED ORDER — DICYCLOMINE HCL 10 MG/ML IM SOLN
20.0000 mg | Freq: Once | INTRAMUSCULAR | Status: AC
  Administered 2023-09-26: 20 mg via INTRAMUSCULAR
  Filled 2023-09-26: qty 2

## 2023-09-26 MED ORDER — IOHEXOL 350 MG/ML SOLN
75.0000 mL | Freq: Once | INTRAVENOUS | Status: AC | PRN
  Administered 2023-09-26: 75 mL via INTRAVENOUS

## 2023-09-26 NOTE — ED Provider Notes (Signed)
  EMERGENCY DEPARTMENT AT [facility] Provider Note   CSN: [id number] Arrival date & time: 09/25/23  2232     History  Chief Complaint  Patient presents with   Abdominal Pain    Hayley Garcia is a 48 y.o. female.  Patient with past medical history significant for prediabetes, GERD, anemia, anxiety presents to the emergency department complaining of right upper quadrant and left upper quadrant abdominal pain which been ongoing for approximately 1 week.  She endorses nausea with no vomiting.  She denies constipation, diarrhea, fever, chest pain, shortness of breath.  She does endorse black-colored stool but believes this may be due to taking Pepto-Bismol.  Spanish-language interpreter used during encounter. Abdominal Pain      Home Medications Prior to Admission medications   Medication Sig Start Date End Date Taking? Authorizing Provider  albuterol  (VENTOLIN  HFA) 108 (90 Base) MCG/ACT inhaler Inhale 2 puffs into the lungs every 6 (six) hours as needed for wheezing or shortness of breath. 11/22/22   Collins Dean, NP  amitriptyline  (ELAVIL ) 25 MG tablet Take 2 tablets (50 mg total) by mouth at bedtime. 08/02/23   Arlee Bellows, NP  famotidine  (PEPCID ) 20 MG tablet Take 1 tablet (20 mg total) by mouth 2 (two) times daily. 05/10/23   Arlee Bellows, NP  metoCLOPramide  (REGLAN ) 10 MG tablet Take 1 tablet (10 mg total) by mouth every 6 (six) hours. Patient not taking: Reported on 04/26/2023 11/13/22   Coretta Dexter, PA  simethicone  (MYLICON) 40 mg/0.38ml SUSP Take 0.6 mLs (40 mg total) by mouth 4 (four) times daily as needed for flatulence. Patient not taking: Reported on 04/26/2023 04/07/23   Mesner, Reymundo Caulk, MD  sucralfate  (CARAFATE ) 1 GM/10ML suspension Take 10 mLs (1 g total) by mouth in the morning one hour prior to breakfast and at bedtime. 05/10/23   Arlee Bellows, NP      Allergies    Patient has no known allergies.    Review of  Systems   Review of Systems  Gastrointestinal:  Positive for abdominal pain.    Physical Exam Updated Vital Signs BP 116/81   Pulse 71   Temp 98 F (36.7 C)   Resp 18   SpO2 98%  Physical Exam Vitals and nursing note reviewed.  Constitutional:      General: She is not in acute distress.    Appearance: She is well-developed.  HENT:     Head: Normocephalic and atraumatic.  Eyes:     Conjunctiva/sclera: Conjunctivae normal.  Cardiovascular:     Rate and Rhythm: Normal rate and regular rhythm.  Pulmonary:     Effort: Pulmonary effort is normal. No respiratory distress.     Breath sounds: Normal breath sounds.  Abdominal:     Palpations: Abdomen is soft.     Tenderness: There is abdominal tenderness in the right upper quadrant and left lower quadrant.  Musculoskeletal:        General: No swelling.     Cervical back: Neck supple.  Skin:    General: Skin is warm and dry.     Capillary Refill: Capillary refill takes less than 2 seconds.  Neurological:     Mental Status: She is alert.  Psychiatric:        Mood and Affect: Mood normal.     ED Results / Procedures / Treatments   Labs (all labs ordered are listed, but only abnormal results are displayed) Labs Reviewed  COMPREHENSIVE METABOLIC PANEL  WITH GFR - Abnormal; Notable for the following components:      Result Value   CO2 21 (*)    Calcium 8.5 (*)    All other components within normal limits  CBC - Abnormal; Notable for the following components:   WBC 11.2 (*)    All other components within normal limits  URINALYSIS, ROUTINE W REFLEX MICROSCOPIC - Abnormal; Notable for the following components:   Color, Urine STRAW (*)    Hgb urine dipstick MODERATE (*)    All other components within normal limits  LIPASE, BLOOD  HCG, SERUM, QUALITATIVE    EKG None  Radiology CT ABDOMEN PELVIS W CONTRAST Result Date: 09/26/2023 CLINICAL DATA:  48 year old female with abdominal pain for 1 week with nausea. Dark colored  stool. EXAM: CT ABDOMEN AND PELVIS WITH CONTRAST TECHNIQUE: Multidetector CT imaging of the abdomen and pelvis was performed using the standard protocol following bolus administration of intravenous contrast. RADIATION DOSE REDUCTION: This exam was performed according to the departmental dose-optimization program which includes automated exposure control, adjustment of the mA and/or kV according to patient size and/or use of iterative reconstruction technique. CONTRAST:  75mL OMNIPAQUE  IOHEXOL  350 MG/ML SOLN COMPARISON:  CT Abdomen and Pelvis 04/26/2023. FINDINGS: Lower chest: Heart size is within normal limits. No pericardial or pleural effusion. Costophrenic angle atelectasis is mild, greater on the right. Hepatobiliary: Chronic liver dome hemangioma is hypodense on today's exam (series 3, image 10), showed typical enhancement on CT Abdomen and Pelvis 01/01/2023, and stable since that time (no follow-up imaging recommended). Otherwise negative liver and gallbladder. Pancreas: Negative. Spleen: Negative. Adrenals/Urinary Tract: Normal adrenal glands. Kidneys appears symmetric and normal, nonobstructed. No evidence of nephrolithiasis or renal inflammation. Diminutive ureters. Urinary bladder is distended similar to the August CT (sagittal image 106), otherwise unremarkable. No urinary calculus identified. Stomach/Bowel: Mostly decompressed and negative rectum. Mildly redundant, mostly gas-filled sigmoid colon otherwise is within normal limits. Retained gas and stool in the descending and transverse colon segments, unremarkable. Retained stool in the right colon with normal appendix on coronal image 69. No large bowel inflammation identified. Decompressed terminal ileum. No dilated small bowel. Decompressed stomach and duodenum. No free air, free fluid, mesenteric inflammation identified. Vascular/Lymphatic: Major vascular structures in the abdomen and pelvis appear patent and within normal limits. Normal caliber  abdominal aorta. No significant atherosclerosis. No lymphadenopathy identified. Reproductive: Stable, within normal limits. Other: No pelvis free fluid. Musculoskeletal: Negative. IMPRESSION: 1. Normal appendix. And no acute or inflammatory process identified in the abdomen. 2. Distended urinary bladder, query urinary retention. Otherwise negative CT pelvis. Electronically Signed   By: Marlise Simpers M.D.   On: 09/26/2023 04:22    Procedures Procedures    Medications Ordered in ED Medications  dicyclomine  (BENTYL ) injection 20 mg (has no administration in time range)  iohexol  (OMNIPAQUE ) 350 MG/ML injection 75 mL (75 mLs Intravenous Contrast Given 09/26/23 0403)    ED Course/ Medical Decision Making/ A&P                                 Medical Decision Making Amount and/or Complexity of Data Reviewed Labs: ordered. Radiology: ordered.  Risk Prescription drug management.   This patient presents to the ED for concern of abdominal pain, this involves an extensive number of treatment options, and is a complaint that carries with it a high risk of complications and morbidity.  The differential diagnosis includes cholecystitis, diverticulitis, appendicitis, colitis,  others   Co morbidities that complicate the patient evaluation  GERD, anxiety   Additional history obtained:   External records from outside source obtained and reviewed including GI notes.  GI feels that patient's pain is related to gut brain axis disorder (IBS).   Lab Tests:  I Ordered, and personally interpreted labs.  The pertinent results include: Mild leukocytosis with a white count of 11,200.   Imaging Studies ordered:  I ordered imaging studies including CT abdomen pelvis with contrast I independently visualized and interpreted imaging which showed no acute findings I agree with the radiologist interpretation  Medications:  Patient provided bentyl  for pain. Patient had improvement in symptoms.   Social  Determinants of Health:  Patient is self-pay   Test / Admission - Considered:  Patient with no acute findings on CT.  CT scan does mention possible urinary retention but patient is urinating without difficulty.  I feel the patient symptoms are most likely related to her underlying IBS is currently being treated/followed by gastroenterology.  Will refer patient back to gastroenterology for further management as needed.  No appendicitis, cholecystitis, diverticulitis, or other surgical/life-threatening conditions identified on CT scan.  Labs grossly unremarkable.  Patient stable for discharge home.         Final Clinical Impression(s) / ED Diagnoses Final diagnoses:  Abdominal pain, unspecified abdominal location    Rx / DC Orders ED Discharge Orders     None         Delories Fetter 09/26/23 0540    Alissa April, MD 09/26/23 [phone]

## 2023-09-26 NOTE — Discharge Instructions (Addendum)
 Your workup this morning was reassuring.  Please follow-up with your gastroenterologist for further management as needed.

## 2023-10-04 ENCOUNTER — Ambulatory Visit: Payer: Self-pay | Admitting: Nurse Practitioner

## 2023-10-04 ENCOUNTER — Ambulatory Visit: Payer: Self-pay | Admitting: Physician Assistant

## 2023-10-04 ENCOUNTER — Ambulatory Visit: Payer: Self-pay | Admitting: Gastroenterology

## 2023-10-04 NOTE — Progress Notes (Deleted)
 HPI : Hayley Garcia is a 48 y.o. female with chronic functional abdominal pain who presents for follow-up.  She has had numerous emergency department encounters over the past 2 years and has had extensive imaging of her abdomen to include multiple CT scans, right upper quadrant ultrasounds, 2 upper endoscopies, all of which have been unremarkable.  She has had a negative fecal occult blood test, negative GI pathogen panel and normal lipase and liver enzymes on numerous occasions.  She has had a history of H. pylori in the past diagnosed via breath test, but has had 2 negative subsequent breath test and 2 negative subsequent upper and endoscopies with gastric biopsies demonstrating eradication of the H. pylori.  She does suffer from significant depression, and she was started on low-dose Elavil  in January 2024 to treat functional abdominal pain.  Her dose was increased to 50 mg in December 2024.     CT abdomen/pelvis September 26, 2023 IMPRESSION: 1. Normal appendix. And no acute or inflammatory process identified in the abdomen. 2. Distended urinary bladder, query urinary retention. Otherwise negative CT pelvis  CT abdomen/pelvis April 26, 2023 IMPRESSION: 1. Normal appendix. And no acute or inflammatory process identified in the abdomen. 2. Distended urinary bladder, query urinary retention. Otherwise negative CT pelvis  CT renal April 07, 2023 IMPRESSION: 1. No acute intra-abdominal pathology identified. No definite radiographic explanation for the patient's reported symptoms  CT abdomen/pelvis January 01, 2023 IMPRESSION: 1. No acute intra-abdominal pathology identified. No definite radiographic explanation for the patient's reported symptoms.  Right upper quadrant ultrasound January 01, 2023 IMPRESSION: 1. No cholelithiasis or acute cholecystitis. 2. Hepatic steatosis   EGD 07/28/22 for hematemesis Normal esophagus.   Normal duodenum.   Normal stomach.   Biopsied..   Bleed felt to be secondary to a pharyngeal source or Mallory-Weiss tear which is subsequently healed.   Surgical [P], gastric bx - ANTRAL AND OXYNTIC MUCOSA WITH REACTIVE (CHEMICAL) GASTROPATHY AND MILD CHRONIC INFLAMMATION - IMMUNOHISTOCHEMICAL STAIN FOR HELICOBACTER ORGANISMS IS NEGATIVE   CT abd/pelvis May 28, 2022 IMPRESSION: No acute findings or other significant abnormality within the abdomen or pelvis.  RUQUS  Mar 12, 2022 IMPRESSION: Stable fatty liver. No acute abnormality noted  RUQUS September 09, 2021 IMPRESSION: Hepatic steatosis without focal liver lesions.  EGD July 01, 2021 (abdominal pain) Normal esophagus Normal stomach, biopsied Normal duodenum  Surgical [P], gastric antrum REACTIVE GASTROPATHY AND MILD CHRONIC GASTRITIS WITH LYMPHOID AGGREGATES HELICOBACTER STAIN NEGATIVE (IHC, ADEQUATE CONTROL) NEGATIVE FOR INTESTINAL METAPLASIA, DYSPLASIA AND CARCINOMA  Colonoscopy February 2023 -Normal colon/terminal ileum.  Repeat in 10 years   CT abdomen/pelvis Sept 25, 2022 IMPRESSION: 1. No acute abnormality in the abdomen/pelvis. 2. Septated 3.9 cm left ovarian cyst. There may be some peripheral septal thickening. Because this lesion is not adequately characterized, prompt US  is recommended for further evaluation. Note: This recommendation does not apply to premenarchal patients and to those with increased risk (genetic, family history, elevated tumor markers or other high-risk factors) of ovarian cancer. Reference: JACR 2020 Feb; 17(2):248-254 3. Small fat containing umbilical hernia.  RUQUS Sept 24, 2022 IMPRESSION: Unremarkable right upper quadrant ultrasound.       Past Medical History:  Diagnosis Date   Anemia    hx of   Anxiety    not on meds at this time   Back pain    Chronic headaches    Continuous urine leakage    Cough    Excessive thirst    GERD (gastroesophageal  reflux disease)    not on meds as of 06/17/2021    Hepatic steatosis    Menstrual pain    Night sweats    Pre-diabetes    Shortness of breath    Sore throat    Umbilical hernia    Vision changes      Past Surgical History:  Procedure Laterality Date   ANKLE SURGERY Left    pin   Family History  Problem Relation Age of Onset   Colon polyps Neg Hx    Colon cancer Neg Hx    Esophageal cancer Neg Hx    Rectal cancer Neg Hx    Stomach cancer Neg Hx    Social History   Tobacco Use   Smoking status: Never   Smokeless tobacco: Never  Vaping Use   Vaping status: Never Used  Substance Use Topics   Alcohol use: Not Currently   Drug use: Never   Current Outpatient Medications  Medication Sig Dispense Refill   albuterol  (VENTOLIN  HFA) 108 (90 Base) MCG/ACT inhaler Inhale 2 puffs into the lungs every 6 (six) hours as needed for wheezing or shortness of breath. 6.7 g 2   amitriptyline  (ELAVIL ) 25 MG tablet Take 2 tablets (50 mg total) by mouth at bedtime. 60 tablet 0   famotidine  (PEPCID ) 20 MG tablet Take 1 tablet (20 mg total) by mouth 2 (two) times daily. 60 tablet 5   metoCLOPramide  (REGLAN ) 10 MG tablet Take 1 tablet (10 mg total) by mouth every 6 (six) hours. (Patient not taking: Reported on 04/26/2023) 30 tablet 0   simethicone  (MYLICON) 40 mg/0.70ml SUSP Take 0.6 mLs (40 mg total) by mouth 4 (four) times daily as needed for flatulence. (Patient not taking: Reported on 04/26/2023) 45 mL 0   sucralfate  (CARAFATE ) 1 GM/10ML suspension Take 10 mLs (1 g total) by mouth in the morning one hour prior to breakfast and at bedtime. 473 mL 1   No current facility-administered medications for this visit.   No Known Allergies   Review of Systems: All systems reviewed and negative except where noted in HPI.    CT ABDOMEN PELVIS W CONTRAST Result Date: 09/26/2023 CLINICAL DATA:  48 year old female with abdominal pain for 1 week with nausea. Dark colored stool. EXAM: CT ABDOMEN AND PELVIS WITH CONTRAST TECHNIQUE: Multidetector CT  imaging of the abdomen and pelvis was performed using the standard protocol following bolus administration of intravenous contrast. RADIATION DOSE REDUCTION: This exam was performed according to the departmental dose-optimization program which includes automated exposure control, adjustment of the mA and/or kV according to patient size and/or use of iterative reconstruction technique. CONTRAST:  75mL OMNIPAQUE  IOHEXOL  350 MG/ML SOLN COMPARISON:  CT Abdomen and Pelvis 04/26/2023. FINDINGS: Lower chest: Heart size is within normal limits. No pericardial or pleural effusion. Costophrenic angle atelectasis is mild, greater on the right. Hepatobiliary: Chronic liver dome hemangioma is hypodense on today's exam (series 3, image 10), showed typical enhancement on CT Abdomen and Pelvis 01/01/2023, and stable since that time (no follow-up imaging recommended). Otherwise negative liver and gallbladder. Pancreas: Negative. Spleen: Negative. Adrenals/Urinary Tract: Normal adrenal glands. Kidneys appears symmetric and normal, nonobstructed. No evidence of nephrolithiasis or renal inflammation. Diminutive ureters. Urinary bladder is distended similar to the August CT (sagittal image 106), otherwise unremarkable. No urinary calculus identified. Stomach/Bowel: Mostly decompressed and negative rectum. Mildly redundant, mostly gas-filled sigmoid colon otherwise is within normal limits. Retained gas and stool in the descending and transverse colon segments, unremarkable. Retained stool in the  right colon with normal appendix on coronal image 69. No large bowel inflammation identified. Decompressed terminal ileum. No dilated small bowel. Decompressed stomach and duodenum. No free air, free fluid, mesenteric inflammation identified. Vascular/Lymphatic: Major vascular structures in the abdomen and pelvis appear patent and within normal limits. Normal caliber abdominal aorta. No significant atherosclerosis. No lymphadenopathy identified.  Reproductive: Stable, within normal limits. Other: No pelvis free fluid. Musculoskeletal: Negative. IMPRESSION: 1. Normal appendix. And no acute or inflammatory process identified in the abdomen. 2. Distended urinary bladder, query urinary retention. Otherwise negative CT pelvis. Electronically Signed   By: Marlise Simpers M.D.   On: 09/26/2023 04:22    Physical Exam: There were no vitals taken for this visit. Constitutional: Pleasant,well-developed, ***female in no acute distress. HEENT: Normocephalic and atraumatic. Conjunctivae are normal. No scleral icterus. Neck supple.  Cardiovascular: Normal rate, regular rhythm.  Pulmonary/chest: Effort normal and breath sounds normal. No wheezing, rales or rhonchi. Abdominal: Soft, nondistended, nontender. Bowel sounds active throughout. There are no masses palpable. No hepatomegaly. Extremities: no edema Lymphadenopathy: No cervical adenopathy noted. Neurological: Alert and oriented to person place and time. Skin: Skin is warm and dry. No rashes noted. Psychiatric: Normal mood and affect. Behavior is normal.  CBC    Component Value Date/Time   WBC 11.2 (H) 09/25/2023 2248   RBC 4.70 09/25/2023 2248   HGB 13.5 09/25/2023 2248   HGB 13.9 03/17/2022 1019   HCT 42.2 09/25/2023 2248   HCT 43.6 03/17/2022 1019   PLT 305 09/25/2023 2248   PLT 324 03/17/2022 1019   MCV 89.8 09/25/2023 2248   MCV 87 03/17/2022 1019   MCH 28.7 09/25/2023 2248   MCHC 32.0 09/25/2023 2248   RDW 14.5 09/25/2023 2248   RDW 13.9 03/17/2022 1019   LYMPHSABS 1.3 05/28/2022 0535   LYMPHSABS 2.1 03/17/2022 1019   MONOABS 0.7 05/28/2022 0535   EOSABS 0.1 05/28/2022 0535   EOSABS 0.4 03/17/2022 1019   BASOSABS 0.0 05/28/2022 0535   BASOSABS 0.1 03/17/2022 1019    CMP     Component Value Date/Time   NA 136 09/25/2023 2248   NA 139 03/17/2022 1019   K 3.9 09/25/2023 2248   CL 104 09/25/2023 2248   CO2 21 (L) 09/25/2023 2248   GLUCOSE 98 09/25/2023 2248   BUN 19  09/25/2023 2248   BUN 12 03/17/2022 1019   CREATININE 0.56 09/25/2023 2248   CALCIUM 8.5 (L) 09/25/2023 2248   PROT 7.1 09/25/2023 2248   PROT 7.2 03/17/2022 1019   ALBUMIN 3.7 09/25/2023 2248   ALBUMIN 4.3 03/17/2022 1019   AST 15 09/25/2023 2248   ALT 13 09/25/2023 2248   ALKPHOS 65 09/25/2023 2248   BILITOT 0.3 09/25/2023 2248   BILITOT 0.3 03/17/2022 1019   GFRNONAA >60 09/25/2023 2248       Latest Ref Rng & Units 09/25/2023   10:48 PM 04/25/2023   10:19 PM 04/06/2023   11:23 PM  CBC EXTENDED  WBC 4.0 - 10.5 K/uL 11.2  13.0  9.0   RBC 3.87 - 5.11 MIL/uL 4.70  4.97  4.80   Hemoglobin 12.0 - 15.0 g/dL 56.2  13.0  86.5   HCT 36.0 - 46.0 % 42.2  42.4  42.2   Platelets 150 - 400 K/uL 305  353  286       ASSESSMENT AND PLAN:  Joaquin Mulberry, MD

## 2023-10-30 ENCOUNTER — Ambulatory Visit: Payer: Self-pay | Attending: Nurse Practitioner | Admitting: Nurse Practitioner

## 2023-10-30 ENCOUNTER — Other Ambulatory Visit: Payer: Self-pay

## 2023-10-30 ENCOUNTER — Encounter: Payer: Self-pay | Admitting: Nurse Practitioner

## 2023-10-30 VITALS — BP 109/74 | HR 72 | Resp 19 | Ht 59.0 in | Wt 117.8 lb

## 2023-10-30 DIAGNOSIS — M545 Low back pain, unspecified: Secondary | ICD-10-CM

## 2023-10-30 DIAGNOSIS — D72829 Elevated white blood cell count, unspecified: Secondary | ICD-10-CM

## 2023-10-30 DIAGNOSIS — R7303 Prediabetes: Secondary | ICD-10-CM

## 2023-10-30 DIAGNOSIS — E559 Vitamin D deficiency, unspecified: Secondary | ICD-10-CM

## 2023-10-30 DIAGNOSIS — R7989 Other specified abnormal findings of blood chemistry: Secondary | ICD-10-CM

## 2023-10-30 MED ORDER — MELOXICAM 7.5 MG PO TABS
7.5000 mg | ORAL_TABLET | Freq: Every day | ORAL | 1 refills | Status: DC
  Filled 2023-10-30: qty 30, 30d supply, fill #0

## 2023-10-30 NOTE — Progress Notes (Signed)
 Assessment & Plan:   Hayley Garcia was seen today for back pain.  Diagnoses and all orders for this visit:  Acute bilateral low back pain without sciatica -     meloxicam (MOBIC) 7.5 MG tablet; Take 1 tablet (7.5 mg total) by mouth daily. Wear back brace at work to support spin  Prediabetes -     Hemoglobin A1c  Low serum calcium -     CMP14+EGFR  Leukocytosis, unspecified type -     CBC with Differential/Platelet  Vitamin D deficiency -     VITAMIN D 25 Hydroxy (Vit-D Deficiency, Fractures)    Patient has been counseled on age-appropriate routine health concerns for screening and prevention. These are reviewed and up-to-date. Referrals have been placed accordingly. Immunizations are up-to-date or declined.    Subjective:   Chief Complaint  Patient presents with   Back Pain    Burning sensation in lower back.    Hayley Garcia 48 y.o. female presents to office today for lower back pain.  She is a patient of Dr. Newlin  Reports 2 weeks of lower back pain. Taking OTC ibuprofen  for pain with little to no relief. She denies any injury or trauma. She states she does a lot of standing at her job which she just started one month ago. She works packing fruit.  She denies any numbness in legs or hips. States she feels heaviness in her feet.  Pain described as  sharp, burning, tight and aching.   She stopped taking amitriptyline  as it caused a sour taste in her mouth. She was prescribed this medication by GI for abdominal pain but states it was for her depression.     10/30/2023   10:28 AM 04/11/2023    3:53 PM 11/22/2022   10:22 AM  Depression screen PHQ 2/9  Decreased Interest 1 1 0  Down, Depressed, Hopeless 0 3 1  PHQ - 2 Score 1 4 1   Altered sleeping 0 0 1  Tired, decreased energy 1 1 1   Change in appetite 1 3 1   Feeling bad or failure about yourself  0 1 0  Trouble concentrating 0 3 0  Moving slowly or fidgety/restless 0 3 0  Suicidal thoughts 0 0 1   PHQ-9 Score 3 15 5   Difficult doing work/chores Not difficult at all       Review of Systems  Constitutional:  Negative for fever, malaise/fatigue and weight loss.  HENT: Negative.  Negative for nosebleeds.   Eyes: Negative.  Negative for blurred vision, double vision and photophobia.  Respiratory: Negative.  Negative for cough and shortness of breath.   Cardiovascular: Negative.  Negative for chest pain, palpitations and leg swelling.  Gastrointestinal: Negative.  Negative for heartburn, nausea and vomiting.  Musculoskeletal:  Positive for back pain. Negative for myalgias.  Neurological: Negative.  Negative for dizziness, focal weakness, seizures and headaches.  Psychiatric/Behavioral: Negative.  Negative for suicidal ideas.     Past Medical History:  Diagnosis Date   Anemia    hx of   Anxiety    not on meds at this time   Back pain    Chronic headaches    Continuous urine leakage    Cough    Excessive thirst    GERD (gastroesophageal reflux disease)    not on meds as of 06/17/2021   Hepatic steatosis    Menstrual pain    Night sweats    Pre-diabetes    Shortness of breath  Sore throat    Umbilical hernia    Vision changes     Past Surgical History:  Procedure Laterality Date   ANKLE SURGERY Left    pin    Family History  Problem Relation Age of Onset   Colon polyps Neg Hx    Colon cancer Neg Hx    Esophageal cancer Neg Hx    Rectal cancer Neg Hx    Stomach cancer Neg Hx     Social History Reviewed with no changes to be made today.   Outpatient Medications Prior to Visit  Medication Sig Dispense Refill   famotidine  (PEPCID ) 20 MG tablet Take 1 tablet (20 mg total) by mouth 2 (two) times daily. 60 tablet 5   albuterol  (VENTOLIN  HFA) 108 (90 Base) MCG/ACT inhaler Inhale 2 puffs into the lungs every 6 (six) hours as needed for wheezing or shortness of breath. (Patient not taking: Reported on 10/30/2023) 6.7 g 2   metoCLOPramide  (REGLAN ) 10 MG tablet Take 1  tablet (10 mg total) by mouth every 6 (six) hours. (Patient not taking: Reported on 04/26/2023) 30 tablet 0   simethicone  (MYLICON) 40 mg/0.82ml SUSP Take 0.6 mLs (40 mg total) by mouth 4 (four) times daily as needed for flatulence. (Patient not taking: Reported on 04/26/2023) 45 mL 0   sucralfate  (CARAFATE ) 1 GM/10ML suspension Take 10 mLs (1 g total) by mouth in the morning one hour prior to breakfast and at bedtime. (Patient not taking: Reported on 10/30/2023) 473 mL 1   amitriptyline  (ELAVIL ) 25 MG tablet Take 2 tablets (50 mg total) by mouth at bedtime. (Patient not taking: Reported on 10/30/2023) 60 tablet 0   No facility-administered medications prior to visit.    No Known Allergies     Objective:    BP 109/74 (BP Location: Left Arm, Patient Position: Sitting, Cuff Size: Normal)   Pulse 72   Resp 19   Ht 4\' 11"  (1.499 m)   Wt 117 lb 12.8 oz (53.4 kg)   SpO2 100%   BMI 23.79 kg/m  Wt Readings from Last 3 Encounters:  10/30/23 117 lb 12.8 oz (53.4 kg)  05/10/23 122 lb 9.6 oz (55.6 kg)  04/26/23 120 lb (54.4 kg)    Physical Exam Vitals and nursing note reviewed.  Constitutional:      Appearance: She is well-developed.  HENT:     Head: Normocephalic and atraumatic.  Cardiovascular:     Rate and Rhythm: Normal rate and regular rhythm.     Heart sounds: Normal heart sounds. No murmur heard.    No friction rub. No gallop.  Pulmonary:     Effort: Pulmonary effort is normal. No tachypnea or respiratory distress.     Breath sounds: Normal breath sounds. No decreased breath sounds, wheezing, rhonchi or rales.  Chest:     Chest wall: No tenderness.  Abdominal:     General: Bowel sounds are normal.     Palpations: Abdomen is soft.  Musculoskeletal:        General: Normal range of motion.     Cervical back: Normal range of motion.  Skin:    General: Skin is warm and dry.  Neurological:     Mental Status: She is alert and oriented to person, place, and time.     Coordination:  Coordination normal.  Psychiatric:        Behavior: Behavior normal. Behavior is cooperative.        Thought Content: Thought content normal.  Judgment: Judgment normal.          Patient has been counseled extensively about nutrition and exercise as well as the importance of adherence with medications and regular follow-up. The patient was given clear instructions to go to ER or return to medical center if symptoms don't improve, worsen or new problems develop. The patient verbalized understanding.   Follow-up: Return if symptoms worsen or fail to improve.   Collins Dean, FNP-BC [facility] [place], [place] [phone]   10/30/2023, 11:58 AM

## 2023-10-31 LAB — CMP14+EGFR
ALT: 11 IU/L (ref 0–32)
AST: 15 IU/L (ref 0–40)
Albumin: 4.3 g/dL (ref 3.9–4.9)
Alkaline Phosphatase: 87 IU/L (ref 44–121)
BUN/Creatinine Ratio: 22 (ref 9–23)
BUN: 11 mg/dL (ref 6–24)
Bilirubin Total: 0.2 mg/dL (ref 0.0–1.2)
CO2: 20 mmol/L (ref 20–29)
Calcium: 8.8 mg/dL (ref 8.7–10.2)
Chloride: 104 mmol/L (ref 96–106)
Creatinine, Ser: 0.5 mg/dL — ABNORMAL LOW (ref 0.57–1.00)
Globulin, Total: 3 g/dL (ref 1.5–4.5)
Glucose: 85 mg/dL (ref 70–99)
Potassium: 4.7 mmol/L (ref 3.5–5.2)
Sodium: 138 mmol/L (ref 134–144)
Total Protein: 7.3 g/dL (ref 6.0–8.5)
eGFR: 116 mL/min/{1.73_m2} (ref >59)

## 2023-10-31 LAB — CBC WITH DIFFERENTIAL/PLATELET
Basophils Absolute: 0.1 10*3/uL (ref 0.0–0.2)
Basos: 1 %
EOS (ABSOLUTE): 0.4 10*3/uL (ref 0.0–0.4)
Eos: 5 %
Hematocrit: 45.8 % (ref 34.0–46.6)
Hemoglobin: 14.6 g/dL (ref 11.1–15.9)
Immature Grans (Abs): 0 10*3/uL (ref 0.0–0.1)
Immature Granulocytes: 0 %
Lymphocytes Absolute: 2.3 10*3/uL (ref 0.7–3.1)
Lymphs: 27 %
MCH: 28.2 pg (ref 26.6–33.0)
MCHC: 31.9 g/dL (ref 31.5–35.7)
MCV: 88 fL (ref 79–97)
Monocytes Absolute: 0.7 10*3/uL (ref 0.1–0.9)
Monocytes: 8 %
Neutrophils Absolute: 5 10*3/uL (ref 1.4–7.0)
Neutrophils: 58 %
Platelets: 361 10*3/uL (ref 150–450)
RBC: 5.18 x10E6/uL (ref 3.77–5.28)
RDW: 12.7 % (ref 11.7–15.4)
WBC: 8.6 10*3/uL (ref 3.4–10.8)

## 2023-10-31 LAB — VITAMIN D 25 HYDROXY (VIT D DEFICIENCY, FRACTURES): Vit D, 25-Hydroxy: 41.1 ng/mL (ref 30.0–100.0)

## 2023-10-31 LAB — HEMOGLOBIN A1C
Est. average glucose Bld gHb Est-mCnc: 114 mg/dL
Hgb A1c MFr Bld: 5.6 % (ref 4.8–5.6)

## 2023-11-01 ENCOUNTER — Ambulatory Visit: Payer: Self-pay | Admitting: Nurse Practitioner

## 2023-11-17 ENCOUNTER — Other Ambulatory Visit (HOSPITAL_COMMUNITY): Payer: Self-pay

## 2023-11-20 NOTE — Telephone Encounter (Signed)
 Copied from CRM [id number]. Topic: Clinical - Lab/Test Results >> Nov 20, 2023  9:32 AM Larissa RAMAN wrote:  Reason for CRM: Patient calling to obtain lab results. Relayed results per provider's note, verbatim. Patient verbalized understanding, no additional questions. Interpreter services used.

## 2023-11-20 NOTE — Telephone Encounter (Signed)
 FYI

## 2023-12-04 ENCOUNTER — Ambulatory Visit: Payer: Self-pay | Admitting: Gastroenterology

## 2023-12-04 ENCOUNTER — Telehealth: Payer: Self-pay | Admitting: Nurse Practitioner

## 2023-12-04 NOTE — Telephone Encounter (Signed)
 Please advise Dr. Stacia if patient can have a refill of sucralfate .

## 2023-12-04 NOTE — Telephone Encounter (Signed)
 Inbound call from patient, patient canceled appointment for today at 1:30 PM  with Dr. Stacia, patient is requesting a refill for sulcrafate due to having severe acid reflux. Patient was offered appointment in September, states she would like medication and to call back for an appointment.   Routing to Pod A due to [place] being in the hospital.

## 2023-12-05 ENCOUNTER — Other Ambulatory Visit: Payer: Self-pay

## 2023-12-05 MED ORDER — SUCRALFATE 1 GM/10ML PO SUSP
1.0000 g | Freq: Two times a day (BID) | ORAL | 0 refills | Status: DC
  Filled 2023-12-05: qty 473, 24d supply, fill #0

## 2023-12-05 NOTE — Telephone Encounter (Signed)
 Prescription for sucralfate  sent to patient's pharmacy per Dr. Stacia.

## 2023-12-14 ENCOUNTER — Other Ambulatory Visit: Payer: Self-pay

## 2024-01-09 ENCOUNTER — Telehealth: Payer: Self-pay | Admitting: Gastroenterology

## 2024-01-09 NOTE — Telephone Encounter (Signed)
 Inbound call from patient stating she is scheduled for 10/7 but is having extreme abdominal pain and would like to know if nurse has anything sooner for her to be seen in office. Patient also stated she is needing a refill on medication sucralfate    Please advise Thank you

## 2024-01-09 NOTE — Telephone Encounter (Signed)
 Using [address] (spanish) intepreter, [place] ID [phone], I have left a message for patient to call back.

## 2024-01-10 NOTE — Telephone Encounter (Signed)
 With help of [employer] (spanish) interpreter, Harlene ID [phone], I have left a message for patient to call back.

## 2024-01-12 NOTE — Telephone Encounter (Signed)
 Following 2 attempted phone calls and messages left to call back, no return call has been made by patient. Will await response from patient before continuing attempts to speak with her regarding her concerns.

## 2024-01-15 ENCOUNTER — Other Ambulatory Visit: Payer: Self-pay

## 2024-01-15 ENCOUNTER — Emergency Department (HOSPITAL_COMMUNITY)
Admission: EM | Admit: 2024-01-15 | Discharge: 2024-01-16 | Disposition: A | Discharge status: Home / Self Care | Payer: Self-pay | Attending: Emergency Medicine | Admitting: Emergency Medicine

## 2024-01-15 DIAGNOSIS — R1011 Right upper quadrant pain: Secondary | ICD-10-CM | POA: Insufficient documentation

## 2024-01-15 DIAGNOSIS — R1013 Epigastric pain: Secondary | ICD-10-CM | POA: Insufficient documentation

## 2024-01-15 LAB — URINALYSIS, ROUTINE W REFLEX MICROSCOPIC
Bacteria, UA: NONE SEEN
Bilirubin Urine: NEGATIVE
Glucose, UA: NEGATIVE mg/dL
Ketones, ur: NEGATIVE mg/dL
Nitrite: NEGATIVE
Protein, ur: NEGATIVE mg/dL
Specific Gravity, Urine: 1.005 (ref 1.005–1.030)
pH: 6 (ref 5.0–8.0)

## 2024-01-15 LAB — COMPREHENSIVE METABOLIC PANEL WITH GFR
ALT: 12 U/L (ref 0–44)
AST: 17 U/L (ref 15–41)
Albumin: 3.5 g/dL (ref 3.5–5.0)
Alkaline Phosphatase: 62 U/L (ref 38–126)
Anion gap: 11 (ref 5–15)
BUN: 13 mg/dL (ref 6–20)
CO2: 22 mmol/L (ref 22–32)
Calcium: 8.6 mg/dL — ABNORMAL LOW (ref 8.9–10.3)
Chloride: 106 mmol/L (ref 98–111)
Creatinine, Ser: 0.66 mg/dL (ref 0.44–1.00)
GFR, Estimated: >60 mL/min (ref >60)
Glucose, Bld: 92 mg/dL (ref 70–99)
Potassium: 3.8 mmol/L (ref 3.5–5.1)
Sodium: 139 mmol/L (ref 135–145)
Total Bilirubin: 0.3 mg/dL (ref 0.0–1.2)
Total Protein: 6.7 g/dL (ref 6.5–8.1)

## 2024-01-15 LAB — CBC
HCT: 41.8 % (ref 36.0–46.0)
Hemoglobin: 13.1 g/dL (ref 12.0–15.0)
MCH: 27.9 pg (ref 26.0–34.0)
MCHC: 31.3 g/dL (ref 30.0–36.0)
MCV: 89.1 fL (ref 80.0–100.0)
Platelets: 287 K/uL (ref 150–400)
RBC: 4.69 MIL/uL (ref 3.87–5.11)
RDW: 14.6 % (ref 11.5–15.5)
WBC: 9.4 K/uL (ref 4.0–10.5)
nRBC: 0 % (ref 0.0–0.2)

## 2024-01-15 LAB — LIPASE, BLOOD: Lipase: 34 U/L (ref 11–51)

## 2024-01-15 LAB — HCG, SERUM, QUALITATIVE: Preg, Serum: NEGATIVE

## 2024-01-15 NOTE — ED Triage Notes (Signed)
 Patient c/o upper abdominal pain with nausea x 2 weeks. 5/10 pain.

## 2024-01-16 ENCOUNTER — Encounter: Payer: Self-pay | Admitting: Nurse Practitioner

## 2024-01-16 ENCOUNTER — Emergency Department (HOSPITAL_COMMUNITY): Payer: Self-pay

## 2024-01-16 ENCOUNTER — Other Ambulatory Visit: Payer: Self-pay

## 2024-01-16 ENCOUNTER — Ambulatory Visit: Payer: Self-pay | Attending: Nurse Practitioner | Admitting: Nurse Practitioner

## 2024-01-16 VITALS — BP 97/68 | HR 67 | Resp 19 | Ht 59.0 in | Wt 114.6 lb

## 2024-01-16 DIAGNOSIS — R1013 Epigastric pain: Secondary | ICD-10-CM

## 2024-01-16 MED ORDER — OMEPRAZOLE 20 MG PO CPDR
20.0000 mg | DELAYED_RELEASE_CAPSULE | Freq: Two times a day (BID) | ORAL | 0 refills | Status: DC
  Filled 2024-01-16 (×2): qty 60, 30d supply, fill #0

## 2024-01-16 MED ORDER — ALUM & MAG HYDROXIDE-SIMETH 200-200-20 MG/5ML PO SUSP
30.0000 mL | Freq: Once | ORAL | Status: AC
  Administered 2024-01-16: 30 mL via ORAL
  Filled 2024-01-16: qty 30

## 2024-01-16 MED ORDER — SUCRALFATE 1 G PO TABS
1.0000 g | ORAL_TABLET | Freq: Three times a day (TID) | ORAL | 0 refills | Status: DC
Start: 2024-01-16 — End: 2024-03-05
  Filled 2024-01-16: qty 120, 30d supply, fill #0

## 2024-01-16 MED ORDER — OMEPRAZOLE 20 MG PO CPDR
20.0000 mg | DELAYED_RELEASE_CAPSULE | Freq: Every day | ORAL | 0 refills | Status: DC
  Filled 2024-01-16: qty 30, 30d supply, fill #0

## 2024-01-16 NOTE — ED Notes (Signed)
 Patient transported to Ultrasound

## 2024-01-16 NOTE — ED Provider Notes (Signed)
 [facility] Emergency Department Provider Note MRN:  [id number]  Arrival date & time: 01/16/24     Chief Complaint   Abdominal Pain   History of Present Illness   Hayley Garcia is a 48 y.o. year-old female presents to the ED with chief complaint of abdominal pain.  She states that the pain feels like a burning sensation.  States that the pain radiates to her back.  States that it has been going on for 2 weeks.  States that she feels bloated after eating.  States that she has had some diarrhea last week.  Denies dysuria.  Denies fever or chills.  She states that she has some generalized fatigue or weakness in her extremities.  Pain is worse at night.    History provided by patient. Professional Spanish interpreter used through [employer].  Review of Systems  Pertinent positive and negative review of systems noted in HPI.    Physical Exam   Vitals:   01/16/24 0000 01/16/24 0119  BP: 118/87 101/84  Pulse: 79 70  Resp:  16  Temp:  97.8 F (36.6 C)  SpO2: 100% 100%    CONSTITUTIONAL:  Non toxic-appearing, NAD NEURO:  Alert and oriented x 3, CN 3-12 grossly intact EYES:  eyes equal and reactive ENT/NECK:  Supple, no stridor  CARDIO:  normal rate, regular rhythm, appears well-perfused  PULM:  No respiratory distress, CTAB GI/GU:  non-distended, RUQ tenderness MSK/SPINE:  No gross deformities, no edema, moves all extremities  SKIN:  no rash, atraumatic   *Additional and/or pertinent findings included in MDM below  Diagnostic and Interventional Summary    EKG Interpretation Date/Time:    Ventricular Rate:    PR Interval:    QRS Duration:    QT Interval:    QTC Calculation:   R Axis:      Text Interpretation:         Labs Reviewed  COMPREHENSIVE METABOLIC PANEL WITH GFR - Abnormal; Notable for the following components:      Result Value   Calcium 8.6 (*)    All other components within normal limits   URINALYSIS, ROUTINE W REFLEX MICROSCOPIC - Abnormal; Notable for the following components:   Color, Urine STRAW (*)    Hgb urine dipstick SMALL (*)    Leukocytes,Ua SMALL (*)    All other components within normal limits  LIPASE, BLOOD  CBC  HCG, SERUM, QUALITATIVE    US  Abdomen Limited RUQ (LIVER/GB)  Final Result      Medications  alum & mag hydroxide-simeth (MAALOX/MYLANTA) 200-200-20 MG/5ML suspension 30 mL (30 mLs Oral Given 01/16/24 0133)     Procedures  /  Critical Care Procedures  ED Course and Medical Decision Making  I have reviewed the triage vital signs, the nursing notes, and pertinent available records from the EMR.  Social Determinants Affecting Complexity of Care: Patient has no clinically significant social determinants affecting this chief complaint..   ED Course:    Medical Decision Making Patient here with epigastric pain that have been ongoing for about 2 weeks.  Differential diagnosis includes pancreatitis, biliary disease, obstruction, constipation.  Labs are reassuring.  LFTs and lipase are normal, doubt pancreatitis.  Doubt choledocho lithiasis or gallbladder disease.  Right upper quadrant ultrasound for further reassuring due to no gallstones being seen.  There is some hepatic steatosis.    Symptoms improved after GI cocktail.  I do have some concern for peptic ulcer disease, but patient has not had  bleeding, has normal hemoglobin, not tachycardic, not hypotensive.  Will treat with PPI and Carafate .  Recommend outpatient PCP follow-up.  Patient understands and agrees with plan.  Amount and/or Complexity of Data Reviewed Labs: ordered. Radiology: ordered.  Risk OTC drugs. Prescription drug management.         Consultants: No consultations were needed in caring for this patient.   Treatment and Plan: I considered admission due to patient's initial presentation, but after considering the examination and diagnostic results, patient will  not require admission and can be discharged with outpatient follow-up.    Final Clinical Impressions(s) / ED Diagnoses     ICD-10-CM   1. Epigastric pain  R10.13       ED Discharge Orders          Ordered    sucralfate  (CARAFATE ) 1 g tablet  3 times daily with meals & bedtime        01/16/24 0233    omeprazole  (PRILOSEC) 20 MG capsule  Daily        01/16/24 0233              Discharge Instructions Discussed with and Provided to Patient:     Discharge Instructions      Please return for new or worsening symptoms.       Vicky Charleston, PA-C 01/16/24 [id number]    Theadore Ozell HERO, MD 01/16/24 [phone]

## 2024-01-16 NOTE — Progress Notes (Signed)
 Assessment & Plan:  Hayley was seen today for abdominal pain.  Diagnoses and all orders for this visit:  Epigastric pain -     H. pylori breath test -     omeprazole  (PRILOSEC) 20 MG capsule; Take 1 capsule (20 mg total) by mouth 2 (two) times daily before lunch and supper. -     H. pylori Breath Collection Follow up with GI in October.  Recommend continue carafate  and PPI    Patient has been counseled on age-appropriate routine health concerns for screening and prevention. These are reviewed and up-to-date. Referrals have been placed accordingly. Immunizations are up-to-date or declined.    Subjective:   Chief Complaint  Patient presents with   Abdominal Pain    Hayley Garcia 48 y.o. female presents to office today with concerns of re occurring abdominal pain. She is  patient of Dr. Newlin  She was recently seen in the ER with concerns of abdominal pain along with burning sensation in the epigastric area. Pain had been ongoing since early August. Associated symptoms included bloating and diarrhea. Labs did not reveal any acute findings. RUQ US  neg for gallstones but did show mild hepatic stenosis. She was given GI cocktail and symptoms improved. She was prescribed carafate  and PPI. Today she presents with recurrence of symptoms. She is requesting a repeat H pylori test. Last test was 2023 and negative.     Review of Systems  Constitutional:  Positive for malaise/fatigue. Negative for fever and weight loss.  HENT: Negative.  Negative for nosebleeds.   Eyes: Negative.  Negative for blurred vision, double vision and photophobia.  Respiratory: Negative.  Negative for cough and shortness of breath.   Cardiovascular: Negative.  Negative for chest pain, palpitations and leg swelling.  Gastrointestinal:  Positive for abdominal pain. Negative for blood in stool, constipation, diarrhea, heartburn, nausea and vomiting.  Musculoskeletal: Negative.  Negative for myalgias.   Neurological: Negative.  Negative for dizziness, focal weakness, seizures and headaches.  Psychiatric/Behavioral: Negative.  Negative for suicidal ideas.     Past Medical History:  Diagnosis Date   Anemia    hx of   Anxiety    not on meds at this time   Back pain    Chronic headaches    Continuous urine leakage    Cough    Excessive thirst    GERD (gastroesophageal reflux disease)    not on meds as of 06/17/2021   Hepatic steatosis    Menstrual pain    Night sweats    Pre-diabetes    Shortness of breath    Sore throat    Umbilical hernia    Vision changes     Past Surgical History:  Procedure Laterality Date   ANKLE SURGERY Left    pin    Family History  Problem Relation Age of Onset   Colon polyps Neg Hx    Colon cancer Neg Hx    Esophageal cancer Neg Hx    Rectal cancer Neg Hx    Stomach cancer Neg Hx     Social History Reviewed with no changes to be made today.   Outpatient Medications Prior to Visit  Medication Sig Dispense Refill   albuterol  (VENTOLIN  HFA) 108 (90 Base) MCG/ACT inhaler Inhale 2 puffs into the lungs every 6 (six) hours as needed for wheezing or shortness of breath. (Patient not taking: Reported on 01/16/2024) 6.7 g 2   famotidine  (PEPCID ) 20 MG tablet Take 1 tablet (20 mg total)  by mouth 2 (two) times daily. (Patient not taking: Reported on 01/16/2024) 60 tablet 5   metoCLOPramide  (REGLAN ) 10 MG tablet Take 1 tablet (10 mg total) by mouth every 6 (six) hours. (Patient not taking: Reported on 01/16/2024) 30 tablet 0   simethicone  (MYLICON) 40 mg/0.2ml SUSP Take 0.6 mLs (40 mg total) by mouth 4 (four) times daily as needed for flatulence. (Patient not taking: Reported on 01/16/2024) 45 mL 0   sucralfate  (CARAFATE ) 1 g tablet Take 1 tablet (1 g total) by mouth 4 (four) times daily -  with meals and at bedtime. (Patient not taking: Reported on 01/16/2024) 120 tablet 0   meloxicam  (MOBIC ) 7.5 MG tablet Take 1 tablet (7.5 mg total) by mouth daily.  (Patient not taking: Reported on 01/16/2024) 30 tablet 1   omeprazole  (PRILOSEC) 20 MG capsule Take 1 capsule (20 mg total) by mouth daily. (Patient not taking: Reported on 01/16/2024) 30 capsule 0   No facility-administered medications prior to visit.    No Known Allergies     Objective:    BP 97/68 (BP Location: Left Arm, Patient Position: Sitting, Cuff Size: Normal)   Pulse 67   Resp 19   Ht 4' 11 (1.499 m)   Wt 114 lb 9.6 oz (52 kg)   SpO2 98%   BMI 23.15 kg/m  Wt Readings from Last 3 Encounters:  01/16/24 114 lb 9.6 oz (52 kg)  10/30/23 117 lb 12.8 oz (53.4 kg)  05/10/23 122 lb 9.6 oz (55.6 kg)    Physical Exam Vitals and nursing note reviewed.  Constitutional:      Appearance: She is well-developed.  HENT:     Head: Normocephalic and atraumatic.  Cardiovascular:     Rate and Rhythm: Normal rate and regular rhythm.     Heart sounds: Normal heart sounds. No murmur heard.    No friction rub. No gallop.  Pulmonary:     Effort: Pulmonary effort is normal. No tachypnea or respiratory distress.     Breath sounds: Normal breath sounds. No decreased breath sounds, wheezing, rhonchi or rales.  Chest:     Chest wall: No tenderness.  Abdominal:     General: Bowel sounds are normal.     Palpations: Abdomen is soft.  Musculoskeletal:        General: Normal range of motion.     Cervical back: Normal range of motion.  Skin:    General: Skin is warm and dry.  Neurological:     Mental Status: She is alert and oriented to person, place, and time.     Coordination: Coordination normal.  Psychiatric:        Behavior: Behavior normal. Behavior is cooperative.        Thought Content: Thought content normal.        Judgment: Judgment normal.          Patient has been counseled extensively about nutrition and exercise as well as the importance of adherence with medications and regular follow-up. The patient was given clear instructions to go to ER or return to medical center  if symptoms don't improve, worsen or new problems develop. The patient verbalized understanding.   Follow-up: Return in about 4 months (around 05/17/2024) for NEWLIN a1c.   Haze LELON Servant, FNP-BC [facility] [place], [place] [phone]   02/05/2024, 12:43 AM

## 2024-01-16 NOTE — Discharge Instructions (Signed)
Please return for new or worsening symptoms.

## 2024-01-16 NOTE — Patient Instructions (Signed)
 [facility] Located in: [place] [facility] [address] Address: [address], [place], [place] [address] Phone: [phone]

## 2024-01-18 ENCOUNTER — Ambulatory Visit: Payer: Self-pay | Admitting: Nurse Practitioner

## 2024-01-18 LAB — H. PYLORI BREATH TEST: H pylori Breath Test: NEGATIVE

## 2024-01-18 LAB — H. PYLORI BREATH COLLECTION

## 2024-02-05 ENCOUNTER — Encounter: Payer: Self-pay | Admitting: Nurse Practitioner

## 2024-02-06 ENCOUNTER — Ambulatory Visit: Payer: Self-pay

## 2024-02-06 NOTE — Telephone Encounter (Signed)
 Noted

## 2024-02-06 NOTE — Telephone Encounter (Signed)
 FYI Only or Action Required?: FYI only for provider.  Patient was last seen in primary care on 01/16/2024 by Theotis Haze ORN, NP.  Called Nurse Triage reporting Advice Only.  Symptoms began information only call.  Interventions attempted: Other: information only call.  Symptoms are: information only call.  Triage Disposition: Information or Advice Only Call  Patient/caregiver understands and will follow disposition?: Yes                             Copied from CRM #[id number]. Topic: Clinical - Request for Lab/Test Order >> Feb 06, 2024  3:50 PM [name] wrote: Reason for CRM: Interpreter assisted call, ID# [phone], Hayley Garcia.   Received call from patient, returning call for most recent lab results.   Called and spoke to office, advised to contact Nurse Triage to give results to patient.   Sending CRM to [facility] triage. Reason for Disposition  Health information question, no triage required and triager able to answer question  Answer Assessment - Initial Assessment Questions 1. REASON FOR CALL: What is the main reason for your call? or How can I best help you?     Patient calling in (with interpreter on the line) requesting lab results. This RN read result note from 01/16/24 lab to patient. Patient has no additional questions. This RN also assisted patient with scheduling a flu shot  Protocols used: Information Only Call - No Triage-A-AH

## 2024-02-13 ENCOUNTER — Ambulatory Visit: Payer: Self-pay | Attending: Family Medicine

## 2024-02-13 ENCOUNTER — Ambulatory Visit: Payer: Self-pay

## 2024-02-13 DIAGNOSIS — Z23 Encounter for immunization: Secondary | ICD-10-CM

## 2024-02-13 NOTE — Progress Notes (Cosign Needed Addendum)
Flu vaccine administered in left deltoid per protocols.  Information sheet given. Patient denies and pain or discomfort at injection site. Tolerated injection well no reaction.

## 2024-03-05 ENCOUNTER — Other Ambulatory Visit: Payer: Self-pay

## 2024-03-05 ENCOUNTER — Ambulatory Visit: Payer: Self-pay | Admitting: Gastroenterology

## 2024-03-05 ENCOUNTER — Encounter: Payer: Self-pay | Admitting: Gastroenterology

## 2024-03-05 VITALS — BP 100/70 | HR 76 | Ht <= 58 in | Wt 117.5 lb

## 2024-03-05 DIAGNOSIS — R109 Unspecified abdominal pain: Secondary | ICD-10-CM

## 2024-03-05 DIAGNOSIS — Z758 Other problems related to medical facilities and other health care: Secondary | ICD-10-CM

## 2024-03-05 DIAGNOSIS — R1013 Epigastric pain: Secondary | ICD-10-CM

## 2024-03-05 MED ORDER — SUCRALFATE 1 G PO TABS
1.0000 g | ORAL_TABLET | Freq: Three times a day (TID) | ORAL | 3 refills | Status: AC
  Filled 2024-03-05: qty 120, 30d supply, fill #0
  Filled 2024-05-03: qty 120, 30d supply, fill #1

## 2024-03-05 MED ORDER — OMEPRAZOLE 20 MG PO CPDR
20.0000 mg | DELAYED_RELEASE_CAPSULE | Freq: Every day | ORAL | 3 refills | Status: AC
Start: 2024-03-05 — End: ?
  Filled 2024-03-05: qty 90, 90d supply, fill #0
  Filled 2024-05-20: qty 90, 90d supply, fill #1

## 2024-03-05 NOTE — Progress Notes (Signed)
 Discussed the use of AI scribe software for clinical note transcription with the patient, who gave verbal consent to proceed.  HPI : Hayley Garcia is a 48 year old female who presents with chronic abdominal pain and burning sensation.  She primarily speaks Spanish and so is accompanied by a Spanish language interpretor in the office today.  She has had 10 emergency department visits for abdominal pain since September 2022, most recently in August of this year.  She has had multiple outpatient GI visits, as well as multiple clinic visits with her PCP regarding her chronic abdominal pain. She has had 6 CT scans in 5 abdominal ultrasounds to evaluate abdominal pain since September 2022. Two EGDs and a colonoscopy were unrevealing for a cause of chronic abdominal pain. She has a history of H. Pylori, but multiple gastric biopsies and breath tests have been negative.  Today, she reports experiencing a burning sensation in her stomach that radiates to her back. This pain has been persistent for years, occurring daily, but has worsened recently. The pain is severe enough to affect her appetite, and there are days when she does not feel hungry.  She previously tried amitriptyline  for several months, which initially provided some relief, but she discontinued it after being informed by a friend that it was for depression. She also took omeprazole  and sucralfate , which helped alleviate her symptoms significantly. However, after completing the course, her symptoms have started to return.  She is not currently taking omeprazole  but has expressed that the combination of omeprazole  and sucralfate  was beneficial in the past.       RUQUS Sept 24, 2022 IMPRESSION: Unremarkable right upper quadrant ultrasound.     CT abdomen/pelvis Sept 25, 2022 IMPRESSION: 1. No acute abnormality in the abdomen/pelvis. 2. Septated 3.9 cm left ovarian cyst. There may be some peripheral septal  thickening. Because this lesion is not adequately characterized, prompt US  is recommended for further evaluation. Note: This recommendation does not apply to premenarchal patients and to those with increased risk (genetic, family history, elevated tumor markers or other high-risk factors) of ovarian cancer. Reference: JACR 2020 Feb; 17(2):248-254 3. Small fat containing umbilical hernia.      RUQUS September 09, 2021 IMPRESSION: Hepatic steatosis without focal liver lesions.   RUQUS  Mar 12, 2022 IMPRESSION: Stable fatty liver. No acute abnormality noted  CT abd/pelvis May 28, 2022 IMPRESSION: No acute findings or other significant abnormality within the abdomen or pelvis.      RUQUS Aug 2024 IMPRESSION: 1. No cholelithiasis or acute cholecystitis. 2. Hepatic steatosis   CT Abdomen/pelvis Aug 2024 IMPRESSION: 1. No acute intra-abdominal pathology identified. No definite radiographic explanation for the patient's reported symptoms.  CT Renal stone Apr 07, 2023 IMPRESSION: 1. No acute intra-abdominal pathology identified. No definite radiographic explanation for the patient's reported symptoms  CT abdomen/pelvis Apr 26, 2023 IMPRESSION: 1. No specific cause for symptoms. 2. Full urinary bladder  CT Abdomen/pelvis September 26, 2023 IMPRESSION: 1. Normal appendix. And no acute or inflammatory process identified in the abdomen. 2. Distended urinary bladder, query urinary retention. Otherwise negative CT pelvis.  RUQUS Jan 16, 2024 IMPRESSION: 1. No evidence of cholecystitis. 2. Hepatic steatosis.   EGD:  Jul 01, 2021 Normal Gastric biopisies with reactive gastropathy, mild chronic gastritis, H. Pylori neg   Colonoscopy:  Jul 01, 2021 Normal colon/terminal ileum; repeat in 10 years  EGD Feb 2024 - The examined portions of the nasopharynx, oropharynx and larynx were normal.  -  Normal esophagus.  - Normal stomach. Biopsied.  - Normal examined duodenum.  - No  abnormalities to explain patient's hematemesis. Suspect pharyngeal source vs Mallory Weiss tear which has subsequently healed  Surgical [P], gastric bx - ANTRAL AND OXYNTIC MUCOSA WITH REACTIVE (CHEMICAL) GASTROPATHY AND MILD CHRONIC INFLAMMATION - IMMUNOHISTOCHEMICAL STAIN FOR HELICOBACTER ORGANISMS IS NEGATIVE   01/16/2024 H. Pylori breath test negative  05/10/2023 GI pathogen panel negative  Component Ref Range & Units (hover) 1 yr ago (03/17/22) 3 yr ago (12/10/20) 3 yr ago (10/22/20)  H pylori Breath Test Negative Negative Positive Abnormal     Past Medical History:  Diagnosis Date   Anemia    hx of   Anxiety    not on meds at this time   Back pain    Chronic headaches    Continuous urine leakage    Cough    Excessive thirst    GERD (gastroesophageal reflux disease)    not on meds as of 06/17/2021   Hepatic steatosis    Menstrual pain    Night sweats    Pre-diabetes    Shortness of breath    Sore throat    Umbilical hernia    Vision changes      Past Surgical History:  Procedure Laterality Date   ANKLE SURGERY Left    pin   Family History  Problem Relation Age of Onset   Colon polyps Neg Hx    Colon cancer Neg Hx    Esophageal cancer Neg Hx    Rectal cancer Neg Hx    Stomach cancer Neg Hx    Social History   Tobacco Use   Smoking status: Never   Smokeless tobacco: Never  Vaping Use   Vaping status: Never Used  Substance Use Topics   Alcohol use: Not Currently   Drug use: Never   Current Outpatient Medications  Medication Sig Dispense Refill   omeprazole  (PRILOSEC) 20 MG capsule Take 1 capsule (20 mg total) by mouth 2 (two) times daily before lunch and supper. (Patient not taking: Reported on 03/05/2024) 60 capsule 0   sucralfate  (CARAFATE ) 1 g tablet Take 1 tablet (1 g total) by mouth 4 (four) times daily -  with meals and at bedtime. (Patient not taking: Reported on 03/05/2024) 120 tablet 0   No current facility-administered medications for  this visit.   No Known Allergies   Review of Systems: All systems reviewed and negative except where noted in HPI.    No results found.  Physical Exam: BP 100/70 (BP Location: Left Arm, Patient Position: Sitting, Cuff Size: Normal)   Pulse 76   Ht 4' 10 (1.473 m) Comment: height measured without shoes  Wt 117 lb 8 oz (53.3 kg)   LMP 03/02/2024   BMI 24.56 kg/m  Constitutional: Pleasant,well-developed, Hispanic female in no acute distress. HEENT: Normocephalic and atraumatic. Conjunctivae are normal. No scleral icterus. Cardiovascular: Normal rate, regular rhythm.  Pulmonary/chest: Effort normal and breath sounds normal. No wheezing, rales or rhonchi. Abdominal: Soft, nondistended, multi-focal tenderness to light palpation without rigidity or guarding.  Bowel sounds active throughout. There are no masses palpable. No hepatomegaly. Extremities: no edema Neurological: Alert and oriented to person place and time. Skin: Skin is warm and dry. No rashes noted. Psychiatric: Normal mood and affect. Behavior is normal.  CBC    Component Value Date/Time   WBC 9.4 01/15/2024 2057   RBC 4.69 01/15/2024 2057   HGB 13.1 01/15/2024 2057   HGB 14.6 10/30/2023 1127  HCT 41.8 01/15/2024 2057   HCT 45.8 10/30/2023 1127   PLT 287 01/15/2024 2057   PLT 361 10/30/2023 1127   MCV 89.1 01/15/2024 2057   MCV 88 10/30/2023 1127   MCH 27.9 01/15/2024 2057   MCHC 31.3 01/15/2024 2057   RDW 14.6 01/15/2024 2057   RDW 12.7 10/30/2023 1127   LYMPHSABS 2.3 10/30/2023 1127   MONOABS 0.7 05/28/2022 0535   EOSABS 0.4 10/30/2023 1127   BASOSABS 0.1 10/30/2023 1127    CMP     Component Value Date/Time   NA 139 01/15/2024 2057   NA 138 10/30/2023 1127   K 3.8 01/15/2024 2057   CL 106 01/15/2024 2057   CO2 22 01/15/2024 2057   GLUCOSE 92 01/15/2024 2057   BUN 13 01/15/2024 2057   BUN 11 10/30/2023 1127   CREATININE 0.66 01/15/2024 2057   CALCIUM 8.6 (L) 01/15/2024 2057   PROT 6.7  01/15/2024 2057   PROT 7.3 10/30/2023 1127   ALBUMIN 3.5 01/15/2024 2057   ALBUMIN 4.3 10/30/2023 1127   AST 17 01/15/2024 2057   ALT 12 01/15/2024 2057   ALKPHOS 62 01/15/2024 2057   BILITOT 0.3 01/15/2024 2057   BILITOT 0.2 10/30/2023 1127   GFRNONAA >60 01/15/2024 2057       Latest Ref Rng & Units 01/15/2024    8:57 PM 10/30/2023   11:27 AM 09/25/2023   10:48 PM  CBC EXTENDED  WBC 4.0 - 10.5 K/uL 9.4  8.6  11.2   RBC 3.87 - 5.11 MIL/uL 4.69  5.18  4.70   Hemoglobin 12.0 - 15.0 g/dL 86.8  85.3  86.4   HCT 36.0 - 46.0 % 41.8  45.8  42.2   Platelets 150 - 400 K/uL 287  361  305   NEUT# 1.4 - 7.0 x10E3/uL  5.0    Lymph# 0.7 - 3.1 x10E3/uL  2.3        ASSESSMENT AND PLAN:  48 year old female with several years of chronic burning abdominal pain with numerous CT scans, ultrasounds and upper endoscopies as well as repeated negative testing for H. Pylori.  History is consistent with a functional GI disorder/gut-brain axis disorder.   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 Extensive testing normal. Previous amitriptyline  trial provided initial relief but was discontinued. Discussed restarting this medication vs trying an alternative medication such as buspirone.  As the patient reports that omeprazole  and carafate  are most effective, and she would like to continue these medications, will renew these medications for now.  Plan to follow up in 2-3 months to reassess symptoms and possibly restart a TCA or buspirone at that time. - Prescribe omeprazole  20 mg once daily for acid reduction. - Prescribe sucralfate  tablets three times a day  - Provide a 90-day supply with three refills for both medications. - Schedule follow-up appointment in 2-3 months to evaluate symptom control and discuss other medication options if needed.   Recording duration: 22 minutes     Jayliah Benett E. Stacia,  MD [facility]    Delbert Clam, MD

## 2024-03-05 NOTE — Patient Instructions (Signed)
 We have sent the following medications to your pharmacy for you to pick up at your convenience: omeprazole  and sucralfate .   Please follow up with Dr. Stacia on 05/13/24 at 1:30pm.  _______________________________________________________  If your blood pressure at your visit was 140/90 or greater, please contact your primary care physician to follow up on this.  _______________________________________________________  If you are age 48 or older, your body mass index should be between 23-30. Your Body mass index is 24.56 kg/m. If this is out of the aforementioned range listed, please consider follow up with your Primary Care Provider.  If you are age 3 or younger, your body mass index should be between 19-25. Your Body mass index is 24.56 kg/m. If this is out of the aformentioned range listed, please consider follow up with your Primary Care Provider.   ________________________________________________________  The [facility] providers would like to encourage you to use MYCHART to communicate with providers for non-urgent requests or questions.  Due to long hold times on the telephone, sending your provider a message by [facility] may be a faster and more efficient way to get a response.  Please allow 48 business hours for a response.  Please remember that this is for non-urgent requests.  _______________________________________________________  [facility] is using a team-based approach to care.  Your team is made up of your doctor and two to three APPS. Our APPS (Nurse Practitioners and Physician Assistants) work with your physician to ensure care continuity for you. They are fully qualified to address your health concerns and develop a treatment plan. They communicate directly with your gastroenterologist to care for you. Seeing the Advanced Practice Practitioners on your physician's team can help you by facilitating care more promptly, often allowing for earlier appointments,  access to diagnostic testing, procedures, and other specialty referrals.

## 2024-03-08 ENCOUNTER — Other Ambulatory Visit: Payer: Self-pay

## 2024-05-03 ENCOUNTER — Other Ambulatory Visit: Payer: Self-pay

## 2024-05-12 ENCOUNTER — Other Ambulatory Visit: Payer: Self-pay

## 2024-05-12 ENCOUNTER — Encounter (HOSPITAL_COMMUNITY): Payer: Self-pay | Admitting: Emergency Medicine

## 2024-05-12 ENCOUNTER — Emergency Department (HOSPITAL_COMMUNITY)
Admission: EM | Admit: 2024-05-12 | Discharge: 2024-05-13 | Disposition: A | Payer: Self-pay | Attending: Emergency Medicine | Admitting: Emergency Medicine

## 2024-05-12 DIAGNOSIS — R1013 Epigastric pain: Secondary | ICD-10-CM

## 2024-05-12 LAB — CBC
HCT: 45.7 % (ref 36.0–46.0)
Hemoglobin: 14.5 g/dL (ref 12.0–15.0)
MCH: 27.9 pg (ref 26.0–34.0)
MCHC: 31.7 g/dL (ref 30.0–36.0)
MCV: 88.1 fL (ref 80.0–100.0)
Platelets: 329 K/uL (ref 150–400)
RBC: 5.19 MIL/uL — ABNORMAL HIGH (ref 3.87–5.11)
RDW: 14.8 % (ref 11.5–15.5)
WBC: 9.5 K/uL (ref 4.0–10.5)
nRBC: 0 % (ref 0.0–0.2)

## 2024-05-12 LAB — URINALYSIS, ROUTINE W REFLEX MICROSCOPIC
Bilirubin Urine: NEGATIVE
Glucose, UA: NEGATIVE mg/dL
Ketones, ur: NEGATIVE mg/dL
Nitrite: NEGATIVE
Protein, ur: NEGATIVE mg/dL
Specific Gravity, Urine: 1.02 (ref 1.005–1.030)
pH: 5 (ref 5.0–8.0)

## 2024-05-12 LAB — COMPREHENSIVE METABOLIC PANEL WITH GFR
ALT: 21 U/L (ref 0–44)
AST: 26 U/L (ref 15–41)
Albumin: 3.7 g/dL (ref 3.5–5.0)
Alkaline Phosphatase: 79 U/L (ref 38–126)
Anion gap: 12 (ref 5–15)
BUN: 12 mg/dL (ref 6–20)
CO2: 19 mmol/L — ABNORMAL LOW (ref 22–32)
Calcium: 9 mg/dL (ref 8.9–10.3)
Chloride: 105 mmol/L (ref 98–111)
Creatinine, Ser: 0.61 mg/dL (ref 0.44–1.00)
GFR, Estimated: >60 mL/min (ref >60)
Glucose, Bld: 132 mg/dL — ABNORMAL HIGH (ref 70–99)
Potassium: 3.7 mmol/L (ref 3.5–5.1)
Sodium: 136 mmol/L (ref 135–145)
Total Bilirubin: 0.2 mg/dL (ref 0.0–1.2)
Total Protein: 7.4 g/dL (ref 6.5–8.1)

## 2024-05-12 LAB — LIPASE, BLOOD: Lipase: 39 U/L (ref 11–51)

## 2024-05-12 LAB — HCG, SERUM, QUALITATIVE: Preg, Serum: NEGATIVE

## 2024-05-12 NOTE — ED Triage Notes (Signed)
 Patient here with head pain and stomach pain for the past week.  Limited information obtained at quick triage due to patient needing a interpretor.

## 2024-05-13 ENCOUNTER — Ambulatory Visit: Payer: Self-pay | Admitting: Gastroenterology

## 2024-05-13 MED ORDER — SIMETHICONE 80 MG PO CHEW
80.0000 mg | CHEWABLE_TABLET | Freq: Once | ORAL | Status: AC
  Administered 2024-05-13: 05:00:00 80 mg via ORAL
  Filled 2024-05-13: qty 1

## 2024-05-13 MED ORDER — ALUM & MAG HYDROXIDE-SIMETH 200-200-20 MG/5ML PO SUSP
30.0000 mL | Freq: Once | ORAL | Status: AC
  Administered 2024-05-13: 05:00:00 30 mL via ORAL
  Filled 2024-05-13: qty 30

## 2024-05-13 NOTE — Discharge Instructions (Signed)
 Work-up today was reassuring. Recommend to continue your prilosec and carafate .  Can use additional maalox or gas-x as needed. Follow-up with your GI doctor. Return here for new concerns.

## 2024-05-13 NOTE — ED Provider Notes (Signed)
 [place] EMERGENCY DEPARTMENT AT [facility] Provider Note   CSN: [id number] Arrival date & time: 05/12/24  2139     Patient presents with: Headache and Abdominal Pain   Hayley Garcia is a 48 y.o. female.   The history is provided by the patient and medical records. The history is limited by a language barrier. A language interpreter was used (Spanish--Teresa).  Headache Associated symptoms: abdominal pain   Abdominal Pain  48 y.o. F with hx of GERD, chronic abdominal pain, back pain, presenting to the ED for abdominal pain.  Longstanding hx of abdominal pain for several years now, followed by GI with negative work-up thus far.  States pain for the past week-- feels it in her upper abdomen, burning sensation and also has been having some increased belching.  She denies nausea/vomiting.  Has had normal bowel movements.  No fever/chills.  Has been prescribed carafate  and prilosec by GI-- initially told interpreter she was taking this, then stated it wasn't really helping so I stopped taking it.    Prior to Admission medications  Medication Sig Start Date End Date Taking? Authorizing Provider  omeprazole  (PRILOSEC) 20 MG capsule Take 1 capsule (20 mg total) by mouth daily. 03/05/24   Stacia Glendia BRAVO, MD  sucralfate  (CARAFATE ) 1 g tablet Take 1 tablet (1 g total) by mouth in the morning, at noon, and at bedtime. 03/05/24   Stacia Glendia BRAVO, MD    Allergies: Patient has no known allergies.    Review of Systems  Gastrointestinal:  Positive for abdominal pain.  Neurological:  Positive for headaches.    Updated Vital Signs BP 111/78 (BP Location: Right Arm)   Pulse 90   Temp 98.9 F (37.2 C) (Oral)   Resp 18   Ht 4' 10 (1.473 m)   Wt 53 kg   SpO2 100%   BMI 24.42 kg/m   Physical Exam Vitals and nursing note reviewed.  Constitutional:      Appearance: She is well-developed.     Comments: Laying on doctor, general practice with spouse, NAD  HENT:      Head: Normocephalic and atraumatic.  Eyes:     Conjunctiva/sclera: Conjunctivae normal.     Pupils: Pupils are equal, round, and reactive to light.  Cardiovascular:     Rate and Rhythm: Normal rate and regular rhythm.     Heart sounds: Normal heart sounds.  Pulmonary:     Effort: Pulmonary effort is normal.     Breath sounds: Normal breath sounds.  Abdominal:     General: Bowel sounds are normal.     Palpations: Abdomen is soft.     Tenderness: There is no abdominal tenderness.     Comments: Soft, does not appear distended, normal bowel sounds  Musculoskeletal:        General: Normal range of motion.     Cervical back: Normal range of motion.  Skin:    General: Skin is warm and dry.  Neurological:     Mental Status: She is alert and oriented to person, place, and time.     (all labs ordered are listed, but only abnormal results are displayed) Labs Reviewed  COMPREHENSIVE METABOLIC PANEL WITH GFR - Abnormal; Notable for the following components:      Result Value   CO2 19 (*)    Glucose, Bld 132 (*)    All other components within normal limits  CBC - Abnormal; Notable for the following components:   RBC 5.19 (*)  All other components within normal limits  URINALYSIS, ROUTINE W REFLEX MICROSCOPIC - Abnormal; Notable for the following components:   APPearance HAZY (*)    Hgb urine dipstick MODERATE (*)    Leukocytes,Ua MODERATE (*)    Bacteria, UA FEW (*)    All other components within normal limits  LIPASE, BLOOD  HCG, SERUM, QUALITATIVE    EKG: None  Radiology: No results found.   Procedures   Medications Ordered in the ED  simethicone  [facility]) chewable tablet 80 mg (80 mg Oral Given 05/13/24 0453)  alum & mag hydroxide-simeth (MAALOX/MYLANTA) 200-200-20 MG/5ML suspension 30 mL (30 mLs Oral Given 05/13/24 0453)                                    Medical Decision Making Amount and/or Complexity of Data Reviewed Labs: ordered. ECG/medicine tests:  ordered and independent interpretation performed.  Risk OTC drugs.   48 year old female presenting to the ED with epigastric pain.  She describes this as burning.  She does have a longstanding history of same.  Has had extensive workup in the past without any known etiology.  Currently followed by GI but questionable if she is actually taking her medications (gave varying stories to interpreter).  She is afebrile and nontoxic in appearance here.  She does not have any significant abdominal distention.  She has normal bowel sounds but no peritoneal signs on exam.  Vitals are stable.  Workup is without any leukocytosis or electrolyte derangement.  Normal lipase.  Story and symptoms seem quite consistent with prior ER visits.  I suspect this is exacerbation of her chronic abdominal pain.  She does report some increased belching but is not distended and is passing flatus with normal BM's.  I have low suspicion for SBO.  She has had multiple scans, none with acute findings and I do not feel this needs to be repeated emergently today.  Given gas-x and GI cocktail here.  Will need to arrange close OP follow-up with GI.  Encouraged to take her prescribed prilosec and carafate , can use PRN maalox and gas-x as well.  Can return here for new concerns.  Final diagnoses:  Epigastric pain    ED Discharge Orders     None          Jarold Olam HERO, PA-C 05/13/24 0600    Griselda Norris, MD 05/13/24 [phone]

## 2024-05-20 ENCOUNTER — Other Ambulatory Visit: Payer: Self-pay

## 2024-05-21 ENCOUNTER — Ambulatory Visit: Payer: Self-pay | Admitting: Gastroenterology

## 2024-05-21 ENCOUNTER — Other Ambulatory Visit: Payer: Self-pay

## 2024-05-21 ENCOUNTER — Ambulatory Visit: Payer: Self-pay | Admitting: Family Medicine

## 2024-05-21 NOTE — Progress Notes (Deleted)
 "  HPI :     She was last seen by me in October with ongoing constant burning epigastric pain.  She was restarted on omeprazole  and Carafate , as she had expressed that these medicines had helped in the past. She was seen in the emergency department December 15 with ongoing abdominal pain.  Basic labs were unremarkable.  No additional imaging was performed.  She was given Gas-X and a GI cocktail, provided reassurance and discharged back home.  Past Medical History:  Diagnosis Date   Anemia    hx of   Anxiety    not on meds at this time   Back pain    Chronic headaches    Continuous urine leakage    Cough    Excessive thirst    GERD (gastroesophageal reflux disease)    not on meds as of 06/17/2021   Hepatic steatosis    Menstrual pain    Night sweats    Pre-diabetes    Shortness of breath    Sore throat    Umbilical hernia    Vision changes      Past Surgical History:  Procedure Laterality Date   ANKLE SURGERY Left    pin   Family History  Problem Relation Age of Onset   Colon polyps Neg Hx    Colon cancer Neg Hx    Esophageal cancer Neg Hx    Rectal cancer Neg Hx    Stomach cancer Neg Hx    Social History[1] Current Outpatient Medications  Medication Sig Dispense Refill   omeprazole  (PRILOSEC) 20 MG capsule Take 1 capsule (20 mg total) by mouth daily. 90 capsule 3   sucralfate  (CARAFATE ) 1 g tablet Take 1 tablet (1 g total) by mouth in the morning, at noon, and at bedtime. 360 tablet 3   No current facility-administered medications for this visit.   Allergies[2]   Review of Systems: All systems reviewed and negative except where noted in HPI.    No results found.  Physical Exam: There were no vitals taken for this visit. Constitutional: Pleasant,well-developed, ***female in no acute distress. HEENT: Normocephalic and atraumatic. Conjunctivae are normal. No scleral icterus. Neck supple.  Cardiovascular: Normal rate, regular rhythm.  Pulmonary/chest:  Effort normal and breath sounds normal. No wheezing, rales or rhonchi. Abdominal: Soft, nondistended, nontender. Bowel sounds active throughout. There are no masses palpable. No hepatomegaly. Extremities: no edema Lymphadenopathy: No cervical adenopathy noted. Neurological: Alert and oriented to person place and time. Skin: Skin is warm and dry. No rashes noted. Psychiatric: Normal mood and affect. Behavior is normal.  CBC    Component Value Date/Time   WBC 9.5 05/12/2024 2153   RBC 5.19 (H) 05/12/2024 2153   HGB 14.5 05/12/2024 2153   HGB 14.6 10/30/2023 1127   HCT 45.7 05/12/2024 2153   HCT 45.8 10/30/2023 1127   PLT 329 05/12/2024 2153   PLT 361 10/30/2023 1127   MCV 88.1 05/12/2024 2153   MCV 88 10/30/2023 1127   MCH 27.9 05/12/2024 2153   MCHC 31.7 05/12/2024 2153   RDW 14.8 05/12/2024 2153   RDW 12.7 10/30/2023 1127   LYMPHSABS 2.3 10/30/2023 1127   MONOABS 0.7 05/28/2022 0535   EOSABS 0.4 10/30/2023 1127   BASOSABS 0.1 10/30/2023 1127    CMP     Component Value Date/Time   NA 136 05/12/2024 2153   NA 138 10/30/2023 1127   K 3.7 05/12/2024 2153   CL 105 05/12/2024 2153   CO2 19 (L) 05/12/2024  2153   GLUCOSE 132 (H) 05/12/2024 2153   BUN 12 05/12/2024 2153   BUN 11 10/30/2023 1127   CREATININE 0.61 05/12/2024 2153   CALCIUM 9.0 05/12/2024 2153   PROT 7.4 05/12/2024 2153   PROT 7.3 10/30/2023 1127   ALBUMIN 3.7 05/12/2024 2153   ALBUMIN 4.3 10/30/2023 1127   AST 26 05/12/2024 2153   ALT 21 05/12/2024 2153   ALKPHOS 79 05/12/2024 2153   BILITOT 0.2 05/12/2024 2153   BILITOT 0.2 10/30/2023 1127   GFRNONAA >60 05/12/2024 2153       Latest Ref Rng & Units 05/12/2024    9:53 PM 01/15/2024    8:57 PM 10/30/2023   11:27 AM  CBC EXTENDED  WBC 4.0 - 10.5 K/uL 9.5  9.4  8.6   RBC 3.87 - 5.11 MIL/uL 5.19  4.69  5.18   Hemoglobin 12.0 - 15.0 g/dL 85.4  86.8  85.3   HCT 36.0 - 46.0 % 45.7  41.8  45.8   Platelets 150 - 400 K/uL 329  287  361   NEUT# 1.4 - 7.0  x10E3/uL   5.0   Lymph# 0.7 - 3.1 x10E3/uL   2.3       ASSESSMENT AND PLAN:  Delbert Clam, MD     [1]  Social History Tobacco Use   Smoking status: Never   Smokeless tobacco: Never  Vaping Use   Vaping status: Never Used  Substance Use Topics   Alcohol use: Not Currently   Drug use: Never  [2] No Known Allergies  "
